# Patient Record
Sex: Female | Born: 1975
Health system: Southern US, Community
[De-identification: ages and names within clinical notes are randomized; demographics above are authoritative.]

## PROBLEM LIST (undated history)

## (undated) DIAGNOSIS — G43909 Migraine, unspecified, not intractable, without status migrainosus: Secondary | ICD-10-CM

## (undated) DIAGNOSIS — O09899 Supervision of other high risk pregnancies, unspecified trimester: Secondary | ICD-10-CM

## (undated) DIAGNOSIS — Z87440 Personal history of urinary (tract) infections: Secondary | ICD-10-CM

## (undated) DIAGNOSIS — IMO0002 Reserved for concepts with insufficient information to code with codable children: Secondary | ICD-10-CM

## (undated) DIAGNOSIS — F419 Anxiety disorder, unspecified: Secondary | ICD-10-CM

## (undated) DIAGNOSIS — R87619 Unspecified abnormal cytological findings in specimens from cervix uteri: Secondary | ICD-10-CM

## (undated) DIAGNOSIS — T7840XA Allergy, unspecified, initial encounter: Secondary | ICD-10-CM

## (undated) HISTORY — DX: Anxiety disorder, unspecified: F41.9

## (undated) HISTORY — PX: LAPAROSCOPIC APPENDECTOMY: SUR753

## (undated) HISTORY — PX: APPENDECTOMY: SHX54

## (undated) HISTORY — DX: Allergy, unspecified, initial encounter: T78.40XA

## (undated) HISTORY — PX: WISDOM TOOTH EXTRACTION: SHX21

## (undated) HISTORY — DX: Migraine, unspecified, not intractable, without status migrainosus: G43.909

## (undated) HISTORY — PX: COLPOSCOPY: SHX161

---

## 2006-07-19 ENCOUNTER — Observation Stay (HOSPITAL_COMMUNITY): Admission: EM | Admit: 2006-07-19 | Discharge: 2006-07-20 | Payer: Self-pay | Admitting: Emergency Medicine

## 2006-07-19 ENCOUNTER — Ambulatory Visit (HOSPITAL_COMMUNITY): Admission: RE | Admit: 2006-07-19 | Discharge: 2006-07-19 | Payer: Self-pay

## 2006-07-20 ENCOUNTER — Encounter (INDEPENDENT_AMBULATORY_CARE_PROVIDER_SITE_OTHER): Payer: Self-pay | Admitting: *Deleted

## 2007-01-12 ENCOUNTER — Other Ambulatory Visit: Admission: RE | Admit: 2007-01-12 | Discharge: 2007-01-12 | Payer: Self-pay | Admitting: Gynecology

## 2008-02-21 ENCOUNTER — Other Ambulatory Visit: Admission: RE | Admit: 2008-02-21 | Discharge: 2008-02-21 | Payer: Self-pay | Admitting: Gynecology

## 2010-02-17 ENCOUNTER — Inpatient Hospital Stay (HOSPITAL_COMMUNITY): Admission: AD | Admit: 2010-02-17 | Discharge: 2010-02-17 | Payer: Self-pay | Admitting: Obstetrics & Gynecology

## 2010-02-18 ENCOUNTER — Inpatient Hospital Stay (HOSPITAL_COMMUNITY): Admission: AD | Admit: 2010-02-18 | Discharge: 2010-02-21 | Payer: Self-pay | Admitting: Obstetrics and Gynecology

## 2010-02-22 ENCOUNTER — Ambulatory Visit: Admission: RE | Admit: 2010-02-22 | Discharge: 2010-02-22 | Payer: Self-pay | Admitting: Obstetrics and Gynecology

## 2010-09-13 LAB — COMPREHENSIVE METABOLIC PANEL
Albumin: 2.7 g/dL — ABNORMAL LOW (ref 3.5–5.2)
Alkaline Phosphatase: 123 U/L — ABNORMAL HIGH (ref 39–117)
BUN: 5 mg/dL — ABNORMAL LOW (ref 6–23)
CO2: 22 mEq/L (ref 19–32)
Calcium: 8.5 mg/dL (ref 8.4–10.5)
Chloride: 107 mEq/L (ref 96–112)
Creatinine, Ser: 0.54 mg/dL (ref 0.4–1.2)
Glucose, Bld: 81 mg/dL (ref 70–99)
Potassium: 3.7 mEq/L (ref 3.5–5.1)
Sodium: 135 mEq/L (ref 135–145)
Total Protein: 6.1 g/dL (ref 6.0–8.3)

## 2010-09-13 LAB — URINALYSIS, ROUTINE W REFLEX MICROSCOPIC
Ketones, ur: NEGATIVE mg/dL
Nitrite: NEGATIVE
Protein, ur: NEGATIVE mg/dL
Specific Gravity, Urine: <1.005 — ABNORMAL LOW (ref 1.005–1.030)

## 2010-09-13 LAB — CBC
HCT: 37 % (ref 36.0–46.0)
HCT: 37.9 % (ref 36.0–46.0)
Hemoglobin: 12.6 g/dL (ref 12.0–15.0)
MCH: 32.3 pg (ref 26.0–34.0)
MCHC: 33.3 g/dL (ref 30.0–36.0)
MCHC: 34 g/dL (ref 30.0–36.0)
MCHC: 35 g/dL (ref 30.0–36.0)
MCV: 94.9 fL (ref 78.0–100.0)
MCV: 96.2 fL (ref 78.0–100.0)
Platelets: 198 10*3/uL (ref 150–400)
Platelets: 229 10*3/uL (ref 150–400)
RBC: 3.1 MIL/uL — ABNORMAL LOW (ref 3.87–5.11)
RDW: 12 % (ref 11.5–15.5)
RDW: 12.1 % (ref 11.5–15.5)
WBC: 6.2 10*3/uL (ref 4.0–10.5)

## 2010-09-13 LAB — PROTEIN / CREATININE RATIO, URINE: Total Protein, Urine: <6 mg/dL

## 2010-09-13 LAB — URINE MICROSCOPIC-ADD ON: WBC, UA: NONE SEEN WBC/hpf (ref <3)

## 2010-09-13 LAB — URIC ACID: Uric Acid, Serum: 5 mg/dL (ref 2.4–7.0)

## 2010-09-13 LAB — LACTATE DEHYDROGENASE: LDH: 151 U/L (ref 94–250)

## 2010-11-15 NOTE — Op Note (Signed)
NAMEMONSERRATH, JUNIO             ACCOUNT NO.:  [id number]   MEDICAL RECORD NO.:  [id number]          PATIENT TYPE:  INP   LOCATION:  5742                         FACILITY:  [facility]   PHYSICIAN:  Adolph Pollack, M.D.DATE OF BIRTH:  [birth year]   DATE OF PROCEDURE:  07/19/2006  DATE OF DISCHARGE:                               OPERATIVE REPORT   PREOPERATIVE DIAGNOSIS:  Acute appendicitis.   POSTOPERATIVE DIAGNOSIS:  Acute appendicitis.   PROCEDURE:  Laparoscopic appendectomy.   SURGEON:  Adolph Pollack, M.D.   ANESTHESIA:  General.   INDICATIONS:  A 35 year old female had abrupt onset of right lower  quadrant pain.  CT scan demonstrated acute appendicitis.  She now  presents for the above procedure.   TECHNIQUE:  She was seen holding area, then brought to the operating  room, placed supine on the operating table, and a general anesthetic was  administered.  A Foley catheter placed in the bladder.  The abdominal  wall was sterilely prepped and draped.  Dilute Marcaine solution was  infiltrated in the subumbilical region.  A subumbilical incision was  made through the skin, subcutaneous tissue, fascia and peritoneum,  entering the peritoneal cavity under direct vision.  A pursestring  suture of 0 Vicryl was then placed around the fascial edges.  A Hassan  trocar was introduced into the peritoneal cavity and a pneumoperitoneum  created by insufflation of CO2 gas.   Next a laparoscope was introduced.  She was placed in a Trendelenburg  position, the right side tilted slightly up.  A 5 mm trocar was placed  in the left lower quadrant and one in the right upper quadrant.  I  identified the acutely inflamed appendix that did not appear to be  perforated.  The mesoappendix was grasped and the appendix retracted  anteriorly.  I then divided some lateral peritoneal attachments with the  Harmonic scalpel and divided the mesoappendix with the Harmonic scalpel  down to the base of  the cecum.  I then amputated the appendix off the  base of the cecum using the Endo-GIA stapler.  The appendix was put an  Endopouch bag and removed through the subumbilical port.   The subumbilical trocar was replaced.  There was a little bit of  bleeding from the staple line and I applied Hemoclips and these  controlled it.  I then irrigated out the abdominal cavity with a liter  solution and evacuated as much as possible.  No further bleeding was  noted.   The subumbilical trocar was removed and the fascial defect closed under  laparoscopic vision by tightening up and tying down the pursestring  suture.  The remaining trocars were removed and the pneumoperitoneum was  released.  The skin incisions were closed with 4-0 Monocryl subcuticular  stitches followed by Steri-Strips and sterile dressings.   She tolerated the procedure without any apparent complications and was  taken to the recovery room in satisfactory condition.      Adolph Pollack, M.D.  Electronically Signed     TJR/MEDQ  D:  07/19/2006  T:  07/20/2006  Job:  [id number]

## 2010-11-15 NOTE — H&P (Signed)
Beverly Gray, Beverly Gray             ACCOUNT NO.:  [id number]   MEDICAL RECORD NO.:  [id number]          PATIENT TYPE:  INP   LOCATION:  5742                         FACILITY:  [facility]   PHYSICIAN:  Adolph Pollack, M.D.DATE OF BIRTH:  1976-05-04   DATE OF ADMISSION:  07/19/2006  DATE OF DISCHARGE:                              HISTORY & PHYSICAL   REASON FOR ADMISSION:  Acute appendicitis.   HISTORY OF PRESENT ILLNESS:  This is a 35 year old white healthy female  who had the abrupt onset of sharp right lower quadrant pain last night  after eating [place] food.  She tried to treat herself with some  nonsteroidals.  She did have a fever of 101.  The pain persisted and she  went to [facility].  She is noted to a white blood cell  count of 11,100 and negative urine pregnancy test.  She is then sent for  CT scan and came to [facility] where that was performed and  showed acute appendicitis.   PAST MEDICAL HISTORY:  Urinary tract infections.   PREVIOUS OPERATIONS:  Oral surgeries.   ALLERGIES:  None.   MEDICATIONS:  Yasmin oral contraceptive.   SOCIAL HISTORY:  She is married.  Denies tobacco use.  Occasional  alcohol use.  Has moved up here recently from [place].   REVIEW OF SYSTEMS:  GENERAL:  She has otherwise been in good health.  CARDIOVASCULAR:  No heart disease or hypertension.  PULMONARY:  No  asthma, pneumonia.  GI:  No peptic ulcer disease, diverticulitis.  GU:  No kidney stones.  HEMATOLOGIC:  No known bleeding disorders, blood  clots.   PHYSICAL EXAM:  GENERALLY:  A slightly ill female, very pleasant and  cooperative.  Temperature is 97.9, blood pressure is 104/67, pulse 78.  EYES:  Extraocular motions intact.  No icterus.  Neck is supple without  masses or obvious thyroid enlargement.  RESPIRATORY:  Breath sounds equal and clear.  Respirations are  unlabored.  CARDIOVASCULAR:  Demonstrates a regular rate and regular rhythm.  No  murmur.  ABDOMEN:  Is soft.  There is right lower quadrant tenderness and  guarding to palpation and percussion.  Occasional bowel sounds are  heard.  No palpable masses.  EXTREMITIES:  No clubbing, cyanosis or edema.   CT scan was reviewed.   IMPRESSION:  Acute appendicitis-does not appear perforated at this time.   PLAN:  Admit to the hospital, IV antibiotics, laparoscopic possible open  appendectomy.  I have gone over the procedure and the risks with her and  her husband.  The risks include but are not limited to bleeding,  infection, wound healing problems, anesthesia, accidental damage to  internal abdominal organs.  They seem to understand this and agree to  proceed.      Adolph Pollack, M.D.  Electronically Signed     TJR/MEDQ  D:  07/19/2006  T:  07/20/2006  Job:  [id number]

## 2011-01-17 ENCOUNTER — Other Ambulatory Visit: Payer: Self-pay | Admitting: Obstetrics and Gynecology

## 2011-02-11 LAB — ABO/RH: RH Type: POSITIVE

## 2011-07-01 NOTE — L&D Delivery Note (Signed)
Delivery Note At 7:56 PM a viable and healthy female was delivered via Vaginal, Spontaneous Delivery (Presentation: Left Occiput Anterior).  APGAR: 9, 9; weight 7 lb 15.3 oz (3610 g).   Placenta status: Intact, Spontaneous.  Cord: 3 vessels.  Cervix without laceration.  Anesthesia: Pudendal  Episiotomy: Median Lacerations: None  Suture Repair: 3.0 chromic Est. Blood Loss (mL): 400  Mom to postpartum.  Baby to nursery-stable.  Mickel Baas 08/26/2011, 8:41 PM

## 2011-08-26 ENCOUNTER — Encounter (HOSPITAL_COMMUNITY): Payer: Self-pay | Admitting: *Deleted

## 2011-08-26 ENCOUNTER — Inpatient Hospital Stay (HOSPITAL_COMMUNITY)
Admission: AD | Admit: 2011-08-26 | Discharge: 2011-08-28 | DRG: 373 | Disposition: A | Discharge status: Home / Self Care | Payer: BC Managed Care – PPO | Source: Ambulatory Visit | Attending: Obstetrics & Gynecology | Admitting: Obstetrics & Gynecology

## 2011-08-26 ENCOUNTER — Inpatient Hospital Stay (HOSPITAL_COMMUNITY)
Admission: AD | Admit: 2011-08-26 | Discharge: 2011-08-26 | Disposition: A | Discharge status: Hospice | Payer: BC Managed Care – PPO | Source: Ambulatory Visit | Attending: Obstetrics & Gynecology | Admitting: Obstetrics & Gynecology

## 2011-08-26 ENCOUNTER — Encounter (HOSPITAL_COMMUNITY): Payer: Self-pay

## 2011-08-26 DIAGNOSIS — O99892 Other specified diseases and conditions complicating childbirth: Principal | ICD-10-CM | POA: Diagnosis present

## 2011-08-26 DIAGNOSIS — Z22330 Carrier of Group B streptococcus: Secondary | ICD-10-CM

## 2011-08-26 DIAGNOSIS — O479 False labor, unspecified: Secondary | ICD-10-CM | POA: Insufficient documentation

## 2011-08-26 HISTORY — DX: Unspecified abnormal cytological findings in specimens from cervix uteri: R87.619

## 2011-08-26 HISTORY — DX: Supervision of other high risk pregnancies, unspecified trimester: O09.899

## 2011-08-26 HISTORY — DX: Reserved for concepts with insufficient information to code with codable children: IMO0002

## 2011-08-26 HISTORY — DX: Personal history of urinary (tract) infections: Z87.440

## 2011-08-26 LAB — ANTIBODY SCREEN: Antibody Screen: NEGATIVE

## 2011-08-26 LAB — HEPATITIS B SURFACE ANTIGEN: Hepatitis B Surface Ag: NEGATIVE

## 2011-08-26 LAB — CBC
HCT: 35.7 % — ABNORMAL LOW (ref 36.0–46.0)
Hemoglobin: 12.3 g/dL (ref 12.0–15.0)
MCH: 30.7 pg (ref 26.0–34.0)
RBC: 4.01 MIL/uL (ref 3.87–5.11)

## 2011-08-26 LAB — RUBELLA ANTIBODY, IGM: Rubella: IMMUNE

## 2011-08-26 LAB — RPR: RPR: NONREACTIVE

## 2011-08-26 LAB — GC/CHLAMYDIA PROBE AMP, GENITAL

## 2011-08-26 LAB — HM PAP SMEAR

## 2011-08-26 MED ORDER — ZOLPIDEM TARTRATE 5 MG PO TABS: 5.0000 mg | ORAL_TABLET | Freq: Every evening | ORAL | Status: DC | PRN

## 2011-08-26 MED ORDER — LACTATED RINGERS IV SOLN: INTRAVENOUS | Status: DC

## 2011-08-26 MED ORDER — ONDANSETRON HCL 4 MG/2ML IJ SOLN: 4.0000 mg | Freq: Four times a day (QID) | INTRAMUSCULAR | Status: DC | PRN

## 2011-08-26 MED ORDER — WITCH HAZEL-GLYCERIN EX PADS
1.0000 "application " | MEDICATED_PAD | CUTANEOUS | Status: DC | PRN
  Administered 2011-08-27: 1 via TOPICAL

## 2011-08-26 MED ORDER — LIDOCAINE HCL (PF) 1 % IJ SOLN
INTRAMUSCULAR | Status: AC
  Filled 2011-08-26: qty 30

## 2011-08-26 MED ORDER — TETANUS-DIPHTH-ACELL PERTUSSIS 5-2.5-18.5 LF-MCG/0.5 IM SUSP
0.5000 mL | Freq: Once | INTRAMUSCULAR | Status: AC
  Administered 2011-08-27: 0.5 mL via INTRAMUSCULAR
  Filled 2011-08-26: qty 0.5

## 2011-08-26 MED ORDER — ACETAMINOPHEN 325 MG PO TABS: 650.0000 mg | ORAL_TABLET | ORAL | Status: DC | PRN

## 2011-08-26 MED ORDER — OXYCODONE-ACETAMINOPHEN 5-325 MG PO TABS
1.0000 | ORAL_TABLET | ORAL | Status: DC | PRN
  Administered 2011-08-27 (×3): 1 via ORAL
  Filled 2011-08-26 (×3): qty 1

## 2011-08-26 MED ORDER — SENNOSIDES-DOCUSATE SODIUM 8.6-50 MG PO TABS
2.0000 | ORAL_TABLET | Freq: Every day | ORAL | Status: DC
  Administered 2011-08-27: 2 via ORAL

## 2011-08-26 MED ORDER — IBUPROFEN 600 MG PO TABS
600.0000 mg | ORAL_TABLET | Freq: Four times a day (QID) | ORAL | Status: DC
  Administered 2011-08-27 – 2011-08-28 (×5): 600 mg via ORAL
  Filled 2011-08-26 (×5): qty 1

## 2011-08-26 MED ORDER — BENZOCAINE-MENTHOL 20-0.5 % EX AERO
1.0000 "application " | INHALATION_SPRAY | CUTANEOUS | Status: DC | PRN
  Administered 2011-08-27: 1 via TOPICAL

## 2011-08-26 MED ORDER — IBUPROFEN 600 MG PO TABS
600.0000 mg | ORAL_TABLET | Freq: Four times a day (QID) | ORAL | Status: DC | PRN
  Administered 2011-08-26: 600 mg via ORAL
  Filled 2011-08-26: qty 1

## 2011-08-26 MED ORDER — ONDANSETRON HCL 4 MG PO TABS: 4.0000 mg | ORAL_TABLET | ORAL | Status: DC | PRN

## 2011-08-26 MED ORDER — OXYCODONE-ACETAMINOPHEN 5-325 MG PO TABS: 1.0000 | ORAL_TABLET | ORAL | Status: DC | PRN

## 2011-08-26 MED ORDER — LANOLIN HYDROUS EX OINT: TOPICAL_OINTMENT | CUTANEOUS | Status: DC | PRN

## 2011-08-26 MED ORDER — CITRIC ACID-SODIUM CITRATE 334-500 MG/5ML PO SOLN: 30.0000 mL | ORAL | Status: DC | PRN

## 2011-08-26 MED ORDER — PRENATAL MULTIVITAMIN CH
1.0000 | ORAL_TABLET | Freq: Every day | ORAL | Status: DC
  Administered 2011-08-27: 1 via ORAL
  Filled 2011-08-26: qty 1

## 2011-08-26 MED ORDER — BUTORPHANOL TARTRATE 2 MG/ML IJ SOLN
1.0000 mg | INTRAMUSCULAR | Status: DC | PRN
  Administered 2011-08-26: 1 mg via INTRAVENOUS

## 2011-08-26 MED ORDER — OXYTOCIN BOLUS FROM INFUSION
500.0000 mL | Freq: Once | INTRAVENOUS | Status: DC
  Filled 2011-08-26: qty 500

## 2011-08-26 MED ORDER — OXYTOCIN 20 UNITS IN LACTATED RINGERS INFUSION - SIMPLE
INTRAVENOUS | Status: AC
  Filled 2011-08-26: qty 1000

## 2011-08-26 MED ORDER — LIDOCAINE HCL (PF) 1 % IJ SOLN: 30.0000 mL | INTRAMUSCULAR | Status: DC | PRN

## 2011-08-26 MED ORDER — SIMETHICONE 80 MG PO CHEW: 80.0000 mg | CHEWABLE_TABLET | ORAL | Status: DC | PRN

## 2011-08-26 MED ORDER — FLEET ENEMA 7-19 GM/118ML RE ENEM: 1.0000 | ENEMA | RECTAL | Status: DC | PRN

## 2011-08-26 MED ORDER — DIPHENHYDRAMINE HCL 25 MG PO CAPS: 25.0000 mg | ORAL_CAPSULE | Freq: Four times a day (QID) | ORAL | Status: DC | PRN

## 2011-08-26 MED ORDER — LACTATED RINGERS IV SOLN: 500.0000 mL | INTRAVENOUS | Status: DC | PRN

## 2011-08-26 MED ORDER — ONDANSETRON HCL 4 MG/2ML IJ SOLN: 4.0000 mg | INTRAMUSCULAR | Status: DC | PRN

## 2011-08-26 MED ORDER — OXYTOCIN 20 UNITS IN LACTATED RINGERS INFUSION - SIMPLE: 125.0000 mL/h | Freq: Once | INTRAVENOUS | Status: DC

## 2011-08-26 MED ORDER — DIBUCAINE 1 % RE OINT
1.0000 "application " | TOPICAL_OINTMENT | RECTAL | Status: DC | PRN
  Administered 2011-08-27: 1 via RECTAL
  Filled 2011-08-26: qty 28

## 2011-08-26 MED ORDER — SODIUM CHLORIDE 0.9 % IV SOLN
2.0000 g | Freq: Four times a day (QID) | INTRAVENOUS | Status: DC
  Administered 2011-08-26: 2 g via INTRAVENOUS
  Filled 2011-08-26 (×2): qty 2000

## 2011-08-26 MED ORDER — BUTORPHANOL TARTRATE 2 MG/ML IJ SOLN
INTRAMUSCULAR | Status: AC
  Administered 2011-08-26: 1 mg via INTRAVENOUS
  Filled 2011-08-26: qty 1

## 2011-08-26 NOTE — Progress Notes (Signed)
Patient is for labor eval. She states that her membranes were stripped this morning and was 3cm/80 by dr Henderson Cloud. She denies any vaginal bleeding or lof. She reports good fetal movement.

## 2011-08-26 NOTE — Progress Notes (Signed)
Dr Arlyce Dice notified of patient, tracing(including 2 possible late decel at approx. 1316 and 1324), ctx pattern and sve result. Dr Arlyce Dice reviewed tracing and states that the tracing is reactive without decel. His order is for patient to be discharge home.

## 2011-08-26 NOTE — H&P (Signed)
36 y.o. G2P1  Estimated Date of Delivery: 08/30/11 admitted at 39/[redacted] weeks gestation in active labor. Prenatal course was uncomplicated. Prenatal labs: Blood Type:O+.  Screening tests for HIV, Syphilis, Hepatitis B, Rubella sensitivity, fetal anomalies, and gestational diabetes were negative.  GBS screen was positive.   Afebrile, VSS Heart and Lungs: No active disease Abdomen: soft, gravid, EFW 7-8lbs. Cervical exam:  8-9/complete  Impression: Imminent delivery  Plan:  Ampicillin 2 gm IV stat for GBS prophylaxis

## 2011-08-27 LAB — CBC
Hemoglobin: 10.5 g/dL — ABNORMAL LOW (ref 12.0–15.0)
MCH: 30.4 pg (ref 26.0–34.0)
MCHC: 33.3 g/dL (ref 30.0–36.0)
MCV: 91.3 fL (ref 78.0–100.0)

## 2011-08-27 LAB — RPR: RPR Ser Ql: NONREACTIVE

## 2011-08-27 MED ORDER — BENZOCAINE-MENTHOL 20-0.5 % EX AERO
INHALATION_SPRAY | CUTANEOUS | Status: AC
  Administered 2011-08-27: 1 via TOPICAL
  Filled 2011-08-27: qty 56

## 2011-08-27 NOTE — Progress Notes (Signed)
Both Mother and Pecola Leisure doing well.  Does desire circ.  Circ discussed with Mom and Dad.  Breast feed.  CBC today:  10.5/14.9 with 209K platelets.  Mom to remind Peds that she did probably not get adequate antibiotic coverage for Group B Strep because of rapidity of presentation and delivery.

## 2011-08-28 NOTE — Progress Notes (Signed)
Patient is eating, ambulating, voiding.  Pain control is good.  Filed Vitals:   08/27/11 0608 08/27/11 1045 08/27/11 2119 08/28/11 0547  BP: 94/54 110/69 107/68 112/74  Pulse: 78 86 70 66  Temp: 98.1 F (36.7 C) 97.4 F (36.3 C) 97.7 F (36.5 C) 98.6 F (37 C)  TempSrc: Oral Oral Oral Oral  Resp: 16 16 18 18   Height:      Weight:      SpO2: 96% 97%      Fundus firm Perineum without swelling.  Lab Results  Component Value Date   WBC 14.9* 08/27/2011   HGB 10.5* 08/27/2011   HCT 31.5* 08/27/2011   MCV 91.3 08/27/2011   PLT 209 08/27/2011    O/Positive/-- (08/14 0000)/RI  A/P Post partum day 2.  Routine care.  Expect d/c today.    Kiyoshi Schaab,Buffi A

## 2011-08-28 NOTE — Discharge Summary (Signed)
Obstetric Discharge Summary Reason for Admission: onset of labor Prenatal Procedures: none Intrapartum Procedures: spontaneous vaginal delivery Postpartum Procedures: none Complications-Operative and Postpartum: none Hemoglobin  Date Value Range Status  08/27/2011 10.5* 12.0-15.0 (g/dL) Final     HCT  Date Value Range Status  08/27/2011 31.5* 36.0-46.0 (%) Final    Discharge Diagnoses: Term Pregnancy-delivered  Discharge Information: Date: 08/28/2011 Activity: pelvic rest Diet: routine Medications: Ibuprofen Condition: stable Instructions: refer to practice specific booklet Discharge to: home Follow-up Information    Follow up with Mickel Baas, MD. Schedule an appointment as soon as possible for a visit in 4 weeks.   Contact information:   [address] [place] [address] [phone]          Newborn Data: Live born female  Birth Weight: 7 lb 15.3 oz (3609 g) APGAR: 9, 9  Home with mother.  Nicanor Mendolia,Patsy A 08/28/2011, 6:41 AM

## 2013-12-07 ENCOUNTER — Telehealth: Payer: Self-pay | Admitting: Infectious Disease

## 2013-12-07 DIAGNOSIS — M25579 Pain in unspecified ankle and joints of unspecified foot: Secondary | ICD-10-CM | POA: Insufficient documentation

## 2013-12-07 NOTE — Telephone Encounter (Signed)
Opt with pain in foot after fall

## 2013-12-08 ENCOUNTER — Other Ambulatory Visit: Payer: Self-pay | Admitting: Infectious Disease

## 2013-12-08 ENCOUNTER — Ambulatory Visit
Admission: RE | Admit: 2013-12-08 | Discharge: 2013-12-08 | Disposition: A | Discharge status: Home / Self Care | Payer: BC Managed Care – PPO | Source: Ambulatory Visit | Attending: Infectious Disease | Admitting: Infectious Disease

## 2013-12-08 ENCOUNTER — Encounter (INDEPENDENT_AMBULATORY_CARE_PROVIDER_SITE_OTHER): Payer: Self-pay

## 2013-12-08 DIAGNOSIS — M25579 Pain in unspecified ankle and joints of unspecified foot: Secondary | ICD-10-CM

## 2013-12-28 ENCOUNTER — Encounter: Payer: Self-pay | Admitting: Gastroenterology

## 2014-03-02 ENCOUNTER — Ambulatory Visit (INDEPENDENT_AMBULATORY_CARE_PROVIDER_SITE_OTHER): Payer: BC Managed Care – PPO | Admitting: Gastroenterology

## 2014-03-02 ENCOUNTER — Encounter: Payer: Self-pay | Admitting: Gastroenterology

## 2014-03-02 VITALS — BP 116/60 | HR 88 | Ht 64.0 in | Wt 160.4 lb

## 2014-03-02 DIAGNOSIS — R197 Diarrhea, unspecified: Secondary | ICD-10-CM

## 2014-03-02 DIAGNOSIS — Z800 Family history of malignant neoplasm of digestive organs: Secondary | ICD-10-CM

## 2014-03-02 DIAGNOSIS — K219 Gastro-esophageal reflux disease without esophagitis: Secondary | ICD-10-CM

## 2014-03-02 MED ORDER — PEG-KCL-NACL-NASULF-NA ASC-C 100 G PO SOLR: 1.0000 | Freq: Once | ORAL | Status: DC

## 2014-03-02 NOTE — Patient Instructions (Signed)
You have been scheduled for a colonoscopy. Please follow written instructions given to you at your visit today.  Please pick up your prep kit at the pharmacy within the next 1-3 days. If you use inhalers (even only as needed), please bring them with you on the day of your procedure. Your physician has requested that you go to www.startemmi.com and enter the access code given to you at your visit today. This web site gives a general overview about your procedure. However, you should still follow specific instructions given to you by our office regarding your preparation for the procedure.  Patient advised to avoid spicy, acidic, citrus, chocolate, mints, fruit and fruit juices.  Limit the intake of caffeine, alcohol and Soda.  Don't exercise too soon after eating.  Don't lie down within 3-4 hours of eating.  Elevate the head of your bed.  Thank you for choosing me and [facility].  Pricilla Riffle. Dagoberto Ligas., MD., Marval Regal  cc: Florina Ou, MD

## 2014-03-02 NOTE — Progress Notes (Signed)
    History of Present Illness: This is a 38 year old female who presents for evaluation of a family history of colon cancer, intermittent diarrhea and reflux symptoms. States her father was diagnosed with advanced stage colon cancer at age 78. He had gastrointestinal symptoms for a few years before his diagnosis. Patient notes intermittent episodes of diarrhea associated with stress and certain foods since she was a child. The symptoms have not changed. She also notes frequent reflux symptoms since the birth of her 2 children. She takes TUMS and Zantac couple times each week with properly for the symptoms. Denies weight loss, abdominal pain, constipation, change in stool caliber, melena, hematochezia, nausea, vomiting, dysphagia, chest pain.    Review of Systems: Pertinent positive and negative review of systems were noted in the above HPI section. All other review of systems were otherwise negative.  Current Medications, Allergies, Past Medical History, Past Surgical History, Family History and Social History were reviewed in [employer] record.  Physical Exam: General: Well developed , well nourished, no acute distress Head: Normocephalic and atraumatic Eyes:  sclerae anicteric, EOMI Ears: Normal auditory acuity Mouth: No deformity or lesions Neck: Supple, no masses or thyromegaly Lungs: Clear throughout to auscultation Heart: Regular rate and rhythm; no murmurs, rubs or bruits Abdomen: Soft, non tender and non distended. No masses, hepatosplenomegaly or hernias noted. Normal Bowel sounds Rectal: Deferred to colonoscopy Musculoskeletal: Symmetrical with no gross deformities  Skin: No lesions on visible extremities Pulses:  Normal pulses noted Extremities: No clubbing, cyanosis, edema or deformities noted Neurological: Alert oriented x 4, grossly nonfocal Cervical Nodes:  No significant cervical adenopathy Inguinal Nodes: No significant inguinal  adenopathy Psychological:  Alert and cooperative. Normal mood and affect  Assessment and Recommendations:  1 Family history of colon cancer in her father at age 45.  Advanced stage colon cancer at the time of his diagnosis. I recommended that she proceed with colonoscopy no later than age 85 and it is certainly reasonable to start colon cancer screening now. The risks, benefits, and alternatives to colonoscopy with possible biopsy and possible polypectomy were discussed with the patient and they consent to proceed.   2. GERD. Standard antireflux measures. Zantac and TUMS as needed.  3. Intermittent diarrhea that appears to be functional.

## 2014-03-14 ENCOUNTER — Encounter: Payer: Self-pay | Admitting: Gastroenterology

## 2014-03-31 ENCOUNTER — Other Ambulatory Visit: Payer: Self-pay | Admitting: Internal Medicine

## 2014-03-31 DIAGNOSIS — B373 Candidiasis of vulva and vagina: Secondary | ICD-10-CM

## 2014-03-31 DIAGNOSIS — B3731 Acute candidiasis of vulva and vagina: Secondary | ICD-10-CM

## 2014-03-31 MED ORDER — FLUCONAZOLE 150 MG PO TABS: 150.0000 mg | ORAL_TABLET | Freq: Every day | ORAL | Status: DC

## 2014-03-31 NOTE — Progress Notes (Signed)
Patient reports having vaginal discharge c/w vaginal yeast infection in setting of completing 7 day course of augmentin. Also started her menses. Will prescribe diflucan to treat vaginal yeast infection.

## 2014-04-18 ENCOUNTER — Encounter: Payer: BC Managed Care – PPO | Admitting: Gastroenterology

## 2014-05-01 ENCOUNTER — Encounter: Payer: Self-pay | Admitting: Gastroenterology

## 2015-05-28 ENCOUNTER — Telehealth: Payer: Self-pay | Admitting: Infectious Diseases

## 2015-05-28 NOTE — Telephone Encounter (Signed)
Pt asked for valtrex refill for concern over recurrence of VZV.  A paper rx is done for her.

## 2015-11-09 ENCOUNTER — Ambulatory Visit (INDEPENDENT_AMBULATORY_CARE_PROVIDER_SITE_OTHER): Payer: 59 | Admitting: Family Medicine

## 2015-11-09 ENCOUNTER — Encounter: Payer: Self-pay | Admitting: Family Medicine

## 2015-11-09 VITALS — BP 106/72 | HR 76 | Temp 98.2°F | Resp 16 | Ht 64.0 in | Wt 164.4 lb

## 2015-11-09 DIAGNOSIS — G43109 Migraine with aura, not intractable, without status migrainosus: Secondary | ICD-10-CM | POA: Diagnosis not present

## 2015-11-09 MED ORDER — SUMATRIPTAN SUCCINATE 50 MG PO TABS: 50.0000 mg | ORAL_TABLET | ORAL | Status: DC | PRN

## 2015-11-09 NOTE — Patient Instructions (Signed)
Schedule your complete physical in 6 months Take the Imitrex as needed for migraines If symptoms change or worsen- please let me know Call with any questions or concerns Welcome!  We're glad to have you!!!

## 2015-11-09 NOTE — Progress Notes (Signed)
Pre visit review using our clinic review tool, if applicable. No additional management support is needed unless otherwise documented below in the visit note. 

## 2015-11-09 NOTE — Progress Notes (Signed)
   Subjective:    Patient ID: Beverly Gray, female    DOB: 1975-11-12, 40 y.o.   MRN: [id number]  HPI New to establish.  Previous MD- Greta Doom  GYN- [facility]  Migraines- started at age 56.  Improved w/ pregnancy.  Have returned recently.  Previously controlled w/ Imitrex or Relpax.  Since pregnancy has been able to treat w/ excedrin, ibuprofen.  Monday had 'a really bad one'- + aura, sensitivity to light, sound, smells.  At times will have difficulty speaking or hearing or comprehending.  Has seen Neuro- Dr Earley Favor- hemiplegic migraines.  The HA from Monday persisted into Tuesday but resolved w/ 50mg  Imitrex.     Review of Systems For ROS see HPI     Objective:   Physical Exam  Constitutional: She is oriented to person, place, and time. She appears well-developed and well-nourished. No distress.  HENT:  Head: Normocephalic and atraumatic.  TMs WNL No TTP over sinuses Minimal nasal congestion  Eyes: Conjunctivae and EOM are normal. Pupils are equal, round, and reactive to light.  Neck: Normal range of motion. Neck supple.  Cardiovascular: Normal rate, regular rhythm, normal heart sounds and intact distal pulses.   Pulmonary/Chest: Effort normal and breath sounds normal. No respiratory distress. She has no wheezes. She has no rales.  Lymphadenopathy:    She has no cervical adenopathy.  Neurological: She is alert and oriented to person, place, and time. She has normal reflexes. No cranial nerve deficit. Coordination normal.  Psychiatric: She has a normal mood and affect. Her behavior is normal. Judgment and thought content normal.  Vitals reviewed.         Assessment & Plan:

## 2015-11-09 NOTE — Assessment & Plan Note (Signed)
New to provider, ongoing for pt.  She has hx of hemiplegic migraines and has had complete neuro workup.  Her HAs, including atypical sxs, improve w/ triptan therapy.  Will restart Imitrex for PRN use.  Reviewed supportive care and red flags that should prompt return.  Pt expressed understanding and is in agreement w/ plan.

## 2015-11-20 ENCOUNTER — Encounter: Payer: Self-pay | Admitting: Family Medicine

## 2015-11-21 MED FILL — SUMATRIPTAN SUCC 50 MG TAB: 50 | 30 days supply | Qty: 9 | Fill #0

## 2015-12-17 MED FILL — SUMATRIPTAN SUCC 50 MG TAB: 50 | 30 days supply | Qty: 9 | Fill #1

## 2015-12-21 ENCOUNTER — Ambulatory Visit: Payer: Self-pay | Admitting: Family Medicine

## 2016-01-23 ENCOUNTER — Encounter: Payer: Self-pay | Admitting: Internal Medicine

## 2016-01-23 ENCOUNTER — Ambulatory Visit (INDEPENDENT_AMBULATORY_CARE_PROVIDER_SITE_OTHER)
Admission: RE | Admit: 2016-01-23 | Discharge: 2016-01-23 | Disposition: A | Discharge status: Home / Self Care | Payer: 59 | Source: Ambulatory Visit | Attending: Internal Medicine | Admitting: Internal Medicine

## 2016-01-23 ENCOUNTER — Ambulatory Visit (INDEPENDENT_AMBULATORY_CARE_PROVIDER_SITE_OTHER): Payer: 59 | Admitting: Internal Medicine

## 2016-01-23 DIAGNOSIS — S300XXA Contusion of lower back and pelvis, initial encounter: Secondary | ICD-10-CM | POA: Insufficient documentation

## 2016-01-23 NOTE — Progress Notes (Signed)
Subjective:  Patient ID: Beverly Gray, female    DOB: 09/14/1975  Age: 40 y.o. MRN: [id number]  CC: Fall (on Saturday she slipped down 4-5 steps hardwood steps) and Bleeding/Bruising (left buttock )   HPI Beverly Gray presents for L buttock large painful bruise - the pt slid fell down the steps on Sat. No LOC, blood in urine, weakness.  Outpatient Medications Prior to Visit  Medication Sig Dispense Refill  . cetirizine (ZYRTEC) 10 MG tablet Take 10 mg by mouth daily.    . Multiple Vitamins-Minerals (MULTIVITAMIN ADULT PO) Take by mouth.    . SUMAtriptan (IMITREX) 50 MG tablet Take 1 tablet (50 mg total) by mouth every 2 (two) hours as needed for migraine. May repeat in 2 hours if headache persists or recurs. 10 tablet 6   No facility-administered medications prior to visit.     ROS Review of Systems  Constitutional: Negative for fever.  Respiratory: Negative for chest tightness.   Gastrointestinal: Negative for abdominal pain, rectal pain and vomiting.  Genitourinary: Negative for difficulty urinating, enuresis, hematuria, pelvic pain and urgency.  Hematological: Does not bruise/bleed easily.    Objective:  BP 120/86   Pulse 67   Wt 164 lb (74.4 kg)   SpO2 96%   BMI 28.15 kg/m   BP Readings from Last 3 Encounters:  01/23/16 120/86  11/09/15 106/72  03/02/14 116/60    Wt Readings from Last 3 Encounters:  01/23/16 164 lb (74.4 kg)  11/09/15 164 lb 6 oz (74.6 kg)  03/02/14 160 lb 6 oz (72.7 kg)    Physical Exam  Constitutional: She appears well-developed. No distress.  HENT:  Head: Normocephalic.  Right Ear: External ear normal.  Left Ear: External ear normal.  Nose: Nose normal.  Mouth/Throat: Oropharynx is clear and moist.  Eyes: Conjunctivae are normal. Pupils are equal, round, and reactive to light. Right eye exhibits no discharge. Left eye exhibits no discharge.  Neck: Normal range of motion. Neck supple. No JVD present. No tracheal deviation present.  No thyromegaly present.  Cardiovascular: Normal rate, regular rhythm and normal heart sounds.   Pulmonary/Chest: No stridor. No respiratory distress. She has no wheezes.  Abdominal: Soft. Bowel sounds are normal. She exhibits no distension and no mass. There is no tenderness. There is no rebound and no guarding.  Musculoskeletal: She exhibits tenderness. She exhibits no edema.  Lymphadenopathy:    She has no cervical adenopathy.  Neurological: She displays normal reflexes. No cranial nerve deficit. She exhibits normal muscle tone. Coordination normal.  Skin: No rash noted. No erythema.  Psychiatric: She has a normal mood and affect. Her behavior is normal. Judgment and thought content normal.  Large prox L buttock bruise extending to the L sacral area 25x20 cm  Lab Results  Component Value Date   WBC 14.9 (H) 08/27/2011   HGB 10.5 (L) 08/27/2011   HCT 31.5 (L) 08/27/2011   PLT 209 08/27/2011   GLUCOSE 81 02/17/2010   ALT 19 02/17/2010   AST 26 02/17/2010   NA 135 02/17/2010   K 3.7 02/17/2010   CL 107 02/17/2010   CREATININE 0.54 02/17/2010   BUN 5 (L) 02/17/2010   CO2 22 02/17/2010    Dg Foot Complete Left  Result Date: 12/08/2013 CLINICAL DATA:  Pain post trauma EXAM: LEFT FOOT - COMPLETE 3+ VIEW COMPARISON:  None. FINDINGS: Frontal, oblique, and lateral views were obtained. There is no fracture or dislocation. Joint spaces appear intact. No erosive change. IMPRESSION: No abnormality  noted. Electronically Signed   By: Lowella Grip M.D.   On: 12/08/2013 08:23    Assessment & Plan:   There are no diagnoses linked to this encounter. I am having [name] maintain her Multiple Vitamins-Minerals (MULTIVITAMIN ADULT PO), cetirizine, and SUMAtriptan.  No orders of the defined types were placed in this encounter.    Follow-up: No Follow-up on file.  Walker Kehr, MD

## 2016-01-23 NOTE — Patient Instructions (Signed)
Heat Arnica Vitamin C

## 2016-01-23 NOTE — Assessment & Plan Note (Addendum)
L - large hematoma Pelvis X ray Heat

## 2016-02-07 DIAGNOSIS — H5213 Myopia, bilateral: Secondary | ICD-10-CM | POA: Diagnosis not present

## 2016-02-25 ENCOUNTER — Ambulatory Visit (INDEPENDENT_AMBULATORY_CARE_PROVIDER_SITE_OTHER): Payer: 59 | Admitting: Family Medicine

## 2016-02-25 ENCOUNTER — Other Ambulatory Visit: Payer: Self-pay | Admitting: Family Medicine

## 2016-02-25 ENCOUNTER — Encounter: Payer: Self-pay | Admitting: Family Medicine

## 2016-02-25 VITALS — BP 110/72 | HR 92 | Temp 98.1°F | Resp 16 | Ht 64.0 in | Wt 164.0 lb

## 2016-02-25 DIAGNOSIS — R197 Diarrhea, unspecified: Secondary | ICD-10-CM | POA: Diagnosis not present

## 2016-02-25 LAB — HEPATIC FUNCTION PANEL
ALBUMIN: 4.2 g/dL (ref 3.5–5.2)
ALT: 14 U/L (ref 0–35)
AST: 16 U/L (ref 0–37)
Alkaline Phosphatase: 69 U/L (ref 39–117)
BILIRUBIN DIRECT: 0.1 mg/dL (ref 0.0–0.3)
BILIRUBIN TOTAL: 0.3 mg/dL (ref 0.2–1.2)
Total Protein: 6.8 g/dL (ref 6.0–8.3)

## 2016-02-25 LAB — BASIC METABOLIC PANEL
BUN: 9 mg/dL (ref 6–23)
CALCIUM: 8.9 mg/dL (ref 8.4–10.5)
CHLORIDE: 105 meq/L (ref 96–112)
CO2: 28 meq/L (ref 19–32)
Creatinine, Ser: 0.62 mg/dL (ref 0.40–1.20)
GFR: 114.12 mL/min (ref >60.00)
Glucose, Bld: 81 mg/dL (ref 70–99)
Potassium: 3.8 mEq/L (ref 3.5–5.1)
SODIUM: 138 meq/L (ref 135–145)

## 2016-02-25 LAB — CBC WITH DIFFERENTIAL/PLATELET
BASOS PCT: 0.6 % (ref 0.0–3.0)
Basophils Absolute: 0 10*3/uL (ref 0.0–0.1)
EOS ABS: 0.2 10*3/uL (ref 0.0–0.7)
Eosinophils Relative: 5.1 % — ABNORMAL HIGH (ref 0.0–5.0)
HCT: 37.4 % (ref 36.0–46.0)
HEMOGLOBIN: 12.7 g/dL (ref 12.0–15.0)
LYMPHS ABS: 2.2 10*3/uL (ref 0.7–4.0)
Lymphocytes Relative: 47.8 % — ABNORMAL HIGH (ref 12.0–46.0)
MCHC: 34 g/dL (ref 30.0–36.0)
MCV: 90.6 fl (ref 78.0–100.0)
MONO ABS: 0.6 10*3/uL (ref 0.1–1.0)
Monocytes Relative: 12.3 % — ABNORMAL HIGH (ref 3.0–12.0)
NEUTROS PCT: 34.2 % — AB (ref 43.0–77.0)
Neutro Abs: 1.6 10*3/uL (ref 1.4–7.7)
PLATELETS: 239 10*3/uL (ref 150.0–400.0)
RBC: 4.12 Mil/uL (ref 3.87–5.11)
RDW: 12.1 % (ref 11.5–15.5)
WBC: 4.6 10*3/uL (ref 4.0–10.5)

## 2016-02-25 NOTE — Patient Instructions (Addendum)
Follow up as needed We'll notify you of your lab results and make any changes if needed Please bring back the stool samples as you are able Drink plenty of fluids to avoid dehydration Call with any questions or concerns Hang in there!!!

## 2016-02-25 NOTE — Progress Notes (Signed)
Pre visit review using our clinic review tool, if applicable. No additional management support is needed unless otherwise documented below in the visit note. 

## 2016-02-25 NOTE — Progress Notes (Signed)
   Subjective:    Patient ID: Beverly Gray, female    DOB: 06-Dec-1975, 40 y.o.   MRN: [id number]  HPI Diarrhea- sxs started 5 days ago on Wednesday night.  Initially thought it was food poisoning b/c 5-6 others in the office developed similar sxs.  No fever.  No N/V.  + abd cramping, bloating, stools vary from loose to watery.  No blood in stool.  No mucous.  At least 3 (plus pt) are still sick.  Pt works at [employer].  No one at home has developed similar sxs.  Stools are foul smelling.  New puppy at home.  At least 5 stools/day   Review of Systems For ROS see HPI     Objective:   Physical Exam  Constitutional: She is oriented to person, place, and time. She appears well-developed and well-nourished. No distress.  HENT:  Head: Normocephalic and atraumatic.  MMM  Neck: Neck supple.  Cardiovascular: Normal rate, regular rhythm and intact distal pulses.   Pulmonary/Chest: Effort normal and breath sounds normal. No respiratory distress. She has no wheezes. She has no rales.  Abdominal: Soft. She exhibits no distension. There is no tenderness. There is no rebound.  Hyperactive BS  Lymphadenopathy:    She has no cervical adenopathy.  Neurological: She is alert and oriented to person, place, and time.  Skin: Skin is warm and dry.  Vitals reviewed.         Assessment & Plan:  Diarrhea- new.  Given duration, lack of fever, and lack of bloody stools suspect that this is not food poisoning.  Will get Rota and C diff studies.  Hold on abx tx until stool studies available.  Get labs to r/o dehydration.  Reviewed supportive care and red flags that should prompt return.  Pt expressed understanding and is in agreement w/ plan.

## 2016-02-26 ENCOUNTER — Encounter: Payer: Self-pay | Admitting: Family Medicine

## 2016-02-26 ENCOUNTER — Other Ambulatory Visit: Payer: Self-pay | Admitting: Obstetrics and Gynecology

## 2016-02-26 DIAGNOSIS — Z124 Encounter for screening for malignant neoplasm of cervix: Secondary | ICD-10-CM | POA: Diagnosis not present

## 2016-02-26 DIAGNOSIS — Z6828 Body mass index (BMI) 28.0-28.9, adult: Secondary | ICD-10-CM | POA: Diagnosis not present

## 2016-02-26 DIAGNOSIS — Z01419 Encounter for gynecological examination (general) (routine) without abnormal findings: Secondary | ICD-10-CM | POA: Diagnosis not present

## 2016-02-26 LAB — HM PAP SMEAR

## 2016-02-26 LAB — C. DIFFICILE GDH AND TOXIN A/B
C. DIFFICILE GDH: NOT DETECTED
C. difficile Toxin A/B: NOT DETECTED

## 2016-02-27 ENCOUNTER — Encounter: Payer: Self-pay | Admitting: Family Medicine

## 2016-02-27 ENCOUNTER — Encounter: Payer: Self-pay | Admitting: General Practice

## 2016-02-27 LAB — ROTAVIRUS ANTIGEN, STOOL: Rotavirus: NOT DETECTED

## 2016-02-27 LAB — CYTOLOGY - PAP

## 2016-02-29 LAB — STOOL CULTURE

## 2016-03-31 MED FILL — SUMATRIPTAN SUCC 50 MG TAB: 50 | 30 days supply | Qty: 9 | Fill #2

## 2016-04-25 ENCOUNTER — Encounter: Payer: Self-pay | Admitting: Family Medicine

## 2016-05-07 ENCOUNTER — Encounter: Payer: 59 | Admitting: Family Medicine

## 2016-05-20 ENCOUNTER — Other Ambulatory Visit: Payer: Self-pay | Admitting: Obstetrics and Gynecology

## 2016-05-20 DIAGNOSIS — N920 Excessive and frequent menstruation with regular cycle: Secondary | ICD-10-CM | POA: Diagnosis not present

## 2016-05-20 DIAGNOSIS — Z3202 Encounter for pregnancy test, result negative: Secondary | ICD-10-CM | POA: Diagnosis not present

## 2016-05-20 DIAGNOSIS — D261 Other benign neoplasm of corpus uteri: Secondary | ICD-10-CM | POA: Diagnosis not present

## 2016-05-20 HISTORY — PX: OTHER SURGICAL HISTORY: SHX169

## 2016-05-27 MED FILL — SUMATRIPTAN SUCC 50 MG TAB: 50 | 30 days supply | Qty: 9 | Fill #3

## 2016-06-11 ENCOUNTER — Ambulatory Visit (INDEPENDENT_AMBULATORY_CARE_PROVIDER_SITE_OTHER): Payer: 59 | Admitting: Family Medicine

## 2016-06-11 ENCOUNTER — Encounter: Payer: Self-pay | Admitting: Family Medicine

## 2016-06-11 VITALS — BP 108/70 | HR 78 | Temp 98.0°F | Resp 16 | Ht 64.0 in | Wt 162.2 lb

## 2016-06-11 DIAGNOSIS — Z0000 Encounter for general adult medical examination without abnormal findings: Secondary | ICD-10-CM | POA: Diagnosis not present

## 2016-06-11 LAB — LIPID PANEL
CHOLESTEROL: 210 mg/dL — AB (ref 0–200)
HDL: 68.1 mg/dL (ref >39.00)
LDL CALC: 126 mg/dL — AB (ref 0–99)
NonHDL: 142.24
TRIGLYCERIDES: 82 mg/dL (ref 0.0–149.0)
Total CHOL/HDL Ratio: 3
VLDL: 16.4 mg/dL (ref 0.0–40.0)

## 2016-06-11 LAB — HEPATIC FUNCTION PANEL
ALBUMIN: 4.6 g/dL (ref 3.5–5.2)
ALK PHOS: 80 U/L (ref 39–117)
ALT: 18 U/L (ref 0–35)
AST: 18 U/L (ref 0–37)
BILIRUBIN DIRECT: 0.1 mg/dL (ref 0.0–0.3)
TOTAL PROTEIN: 7.6 g/dL (ref 6.0–8.3)
Total Bilirubin: 0.7 mg/dL (ref 0.2–1.2)

## 2016-06-11 LAB — BASIC METABOLIC PANEL
BUN: 11 mg/dL (ref 6–23)
CO2: 27 meq/L (ref 19–32)
Calcium: 9.6 mg/dL (ref 8.4–10.5)
Chloride: 104 mEq/L (ref 96–112)
Creatinine, Ser: 0.66 mg/dL (ref 0.40–1.20)
GFR: 106.01 mL/min (ref >60.00)
GLUCOSE: 91 mg/dL (ref 70–99)
POTASSIUM: 4 meq/L (ref 3.5–5.1)
SODIUM: 138 meq/L (ref 135–145)

## 2016-06-11 LAB — CBC WITH DIFFERENTIAL/PLATELET
BASOS PCT: 0.6 % (ref 0.0–3.0)
Basophils Absolute: 0 10*3/uL (ref 0.0–0.1)
EOS PCT: 2.8 % (ref 0.0–5.0)
Eosinophils Absolute: 0.2 10*3/uL (ref 0.0–0.7)
HEMATOCRIT: 42 % (ref 36.0–46.0)
Hemoglobin: 14.1 g/dL (ref 12.0–15.0)
LYMPHS ABS: 3 10*3/uL (ref 0.7–4.0)
Lymphocytes Relative: 53.1 % — ABNORMAL HIGH (ref 12.0–46.0)
MCHC: 33.6 g/dL (ref 30.0–36.0)
MCV: 90.2 fl (ref 78.0–100.0)
MONOS PCT: 10.5 % (ref 3.0–12.0)
Monocytes Absolute: 0.6 10*3/uL (ref 0.1–1.0)
NEUTROS ABS: 1.8 10*3/uL (ref 1.4–7.7)
NEUTROS PCT: 33 % — AB (ref 43.0–77.0)
PLATELETS: 257 10*3/uL (ref 150.0–400.0)
RBC: 4.65 Mil/uL (ref 3.87–5.11)
RDW: 12.3 % (ref 11.5–15.5)
WBC: 5.6 10*3/uL (ref 4.0–10.5)

## 2016-06-11 LAB — TSH: TSH: 0.86 u[IU]/mL (ref 0.35–4.50)

## 2016-06-11 LAB — VITAMIN D 25 HYDROXY (VIT D DEFICIENCY, FRACTURES): VITD: 29.75 ng/mL — ABNORMAL LOW (ref 30.00–100.00)

## 2016-06-11 NOTE — Progress Notes (Signed)
Pre visit review using our clinic review tool, if applicable. No additional management support is needed unless otherwise documented below in the visit note. 

## 2016-06-11 NOTE — Assessment & Plan Note (Signed)
Pt's PE WNL.  UTD on GYN, immunizations.  Check labs.  Anticipatory guidance provided.  

## 2016-06-11 NOTE — Patient Instructions (Signed)
Follow up in 1 year or as needed We'll notify you of your lab results and make any changes if needed Keep up the good work on healthy diet and regular exercise- you look great! Call with any questions or concerns Happy Holidays!!! 

## 2016-06-11 NOTE — Progress Notes (Signed)
   Subjective:    Patient ID: Beverly Gray, female    DOB: 1976/04/02, 40 y.o.   MRN: [id number]  HPI CPE- UTD on GYN, Tdap, flu.  Too young for mammo.  No concerns today.   Review of Systems Patient reports no vision/ hearing changes, adenopathy,fever, weight change,  persistant/recurrent hoarseness , swallowing issues, chest pain, palpitations, edema, persistant/recurrent cough, hemoptysis, dyspnea (rest/exertional/paroxysmal nocturnal), gastrointestinal bleeding (melena, rectal bleeding), abdominal pain, significant heartburn, bowel changes, GU symptoms (dysuria, hematuria, incontinence), Gyn symptoms (abnormal  bleeding, pain),  syncope, focal weakness, memory loss, numbness & tingling, skin/hair/nail changes, abnormal bruising or bleeding, anxiety, or depression.     Objective:   Physical Exam General Appearance:    Alert, cooperative, no distress, appears stated age  Head:    Normocephalic, without obvious abnormality, atraumatic  Eyes:    PERRL, conjunctiva/corneas clear, EOM's intact, fundi    benign, both eyes  Ears:    Normal TM's and external ear canals, both ears  Nose:   Nares normal, septum midline, mucosa normal, no drainage    or sinus tenderness  Throat:   Lips, mucosa, and tongue normal; teeth and gums normal  Neck:   Supple, symmetrical, trachea midline, no adenopathy;    Thyroid: no enlargement/tenderness/nodules  Back:     Symmetric, no curvature, ROM normal, no CVA tenderness  Lungs:     Clear to auscultation bilaterally, respirations unlabored  Chest Wall:    No tenderness or deformity   Heart:    Regular rate and rhythm, S1 and S2 normal, no murmur, rub   or gallop  Breast Exam:    Deferred to GYN  Abdomen:     Soft, non-tender, bowel sounds active all four quadrants,    no masses, no organomegaly  Genitalia:    Deferred to GYN  Rectal:    Extremities:   Extremities normal, atraumatic, no cyanosis or edema  Pulses:   2+ and symmetric all extremities  Skin:    Skin color, texture, turgor normal, no rashes or lesions  Lymph nodes:   Cervical, supraclavicular, and axillary nodes normal  Neurologic:   CNII-XII intact, normal strength, sensation and reflexes    throughout          Assessment & Plan:

## 2016-07-07 MED FILL — SUMATRIPTAN SUCC 50 MG TAB: 50 | 30 days supply | Qty: 9 | Fill #4

## 2016-08-15 ENCOUNTER — Encounter: Payer: Self-pay | Admitting: Family Medicine

## 2016-08-15 ENCOUNTER — Ambulatory Visit (INDEPENDENT_AMBULATORY_CARE_PROVIDER_SITE_OTHER): Payer: 59 | Admitting: Family Medicine

## 2016-08-15 VITALS — BP 112/78 | HR 70 | Temp 98.0°F | Resp 16 | Ht 64.0 in | Wt 161.5 lb

## 2016-08-15 DIAGNOSIS — G43109 Migraine with aura, not intractable, without status migrainosus: Secondary | ICD-10-CM | POA: Diagnosis not present

## 2016-08-15 MED ORDER — PROPRANOLOL HCL ER 60 MG PO CP24: 60.0000 mg | ORAL_CAPSULE | Freq: Every day | ORAL | 3 refills | Status: DC

## 2016-08-15 MED ORDER — SUMATRIPTAN SUCCINATE 100 MG PO TABS: 100.0000 mg | ORAL_TABLET | Freq: Once | ORAL | 6 refills | Status: DC

## 2016-08-15 MED FILL — SUMATRIPTAN SUCC 100 MG TAB: 100 | 30 days supply | Qty: 18 | Fill #0

## 2016-08-15 MED FILL — PROPRANOLOL ER 60 MG CAP: 60 | 30 days supply | Qty: 30 | Fill #0

## 2016-08-15 NOTE — Progress Notes (Signed)
Pre visit review using our clinic review tool, if applicable. No additional management support is needed unless otherwise documented below in the visit note. 

## 2016-08-15 NOTE — Assessment & Plan Note (Signed)
Deteriorated.  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  Will start beta blocker prophylaxis and monitor for decrease in sxs.  Increase the Imitrex to 100mg  daily due to requiring 2 tabs each migraine.  Reviewed supportive care and red flags that should prompt return.  Pt expressed understanding and is in agreement w/ plan.

## 2016-08-15 NOTE — Progress Notes (Signed)
   Subjective:    Patient ID: Beverly Gray, female    DOB: 1975-07-30, 41 y.o.   MRN: [id number]  HPI Migraines- pt reports they are back to 'pre-pregnancy levels'.  HAs are occurring weekly, having 4-5 HAs monthly.  Requiring 2 imitex to break the HA and the next day she's 'wiped out'.  Currently asymptomatic.  Pt is aware of triggers- stress, poor sleep, dehydration, caffeine.  Pt requires imitrex + sleep to break the headache.   Review of Systems For ROS see HPI     Objective:   Physical Exam  Constitutional: She is oriented to person, place, and time. She appears well-developed and well-nourished. No distress.  HENT:  Head: Normocephalic and atraumatic.  Eyes: Conjunctivae and EOM are normal. Pupils are equal, round, and reactive to light.  Neck: Normal range of motion. Neck supple.  Neurological: She is alert and oriented to person, place, and time. She has normal reflexes. No cranial nerve deficit. Coordination normal.  Skin: Skin is warm and dry.  Psychiatric: She has a normal mood and affect. Her behavior is normal. Thought content normal.  Vitals reviewed.         Assessment & Plan:

## 2016-08-15 NOTE — Patient Instructions (Addendum)
Follow up by phone or MyChart in 1 month and let me know how the migraines are doing Start the Propranolol once daily Drink plenty of fluids Continue the Imitrex as needed- I increased the dose to 100mg  so start w/ 1 tab as needed Call with any questions or concerns Hang in there!!!

## 2016-11-18 MED FILL — SUMATRIPTAN SUCC 100 MG TAB: 100 | 30 days supply | Qty: 18 | Fill #1

## 2017-01-22 MED FILL — SUMATRIPTAN SUCC 100 MG TAB: 100 | 30 days supply | Qty: 18 | Fill #2

## 2017-03-06 MED FILL — SUMATRIPTAN SUCC 100 MG TAB: 100 | 30 days supply | Qty: 16 | Fill #3

## 2017-03-27 DIAGNOSIS — H5213 Myopia, bilateral: Secondary | ICD-10-CM | POA: Diagnosis not present

## 2017-05-28 ENCOUNTER — Other Ambulatory Visit: Payer: Self-pay | Admitting: Family Medicine

## 2017-05-28 ENCOUNTER — Encounter: Payer: Self-pay | Admitting: Family Medicine

## 2017-05-28 MED ORDER — SUMATRIPTAN SUCCINATE 100 MG PO TABS: 100.0000 mg | ORAL_TABLET | Freq: Once | ORAL | 6 refills | Status: DC

## 2017-05-28 MED ORDER — PROPRANOLOL HCL 20 MG PO TABS: 20.0000 mg | ORAL_TABLET | Freq: Two times a day (BID) | ORAL | 3 refills | Status: DC

## 2017-05-28 MED FILL — SUMATRIPTAN SUCC 100 MG TAB: 100 | 28 days supply | Qty: 10 | Fill #0

## 2017-05-28 MED FILL — PROPRANOLOL 20 MG TABLET: 20 | 30 days supply | Qty: 60 | Fill #0

## 2017-05-28 NOTE — Telephone Encounter (Signed)
Medication filled by pcp

## 2017-06-26 MED FILL — SUMATRIPTAN SUCC 100 MG TAB: 100 | 28 days supply | Qty: 10 | Fill #1

## 2017-07-15 MED FILL — PROPRANOLOL 20 MG TABLET: 20 | 30 days supply | Qty: 60 | Fill #1

## 2017-07-30 DIAGNOSIS — Z1231 Encounter for screening mammogram for malignant neoplasm of breast: Secondary | ICD-10-CM | POA: Diagnosis not present

## 2017-07-30 DIAGNOSIS — Z01419 Encounter for gynecological examination (general) (routine) without abnormal findings: Secondary | ICD-10-CM | POA: Diagnosis not present

## 2017-07-30 DIAGNOSIS — Z6828 Body mass index (BMI) 28.0-28.9, adult: Secondary | ICD-10-CM | POA: Diagnosis not present

## 2017-07-30 LAB — HM MAMMOGRAPHY: HM Mammogram: NORMAL (ref 0–4)

## 2017-08-03 ENCOUNTER — Other Ambulatory Visit: Payer: Self-pay | Admitting: Obstetrics and Gynecology

## 2017-08-03 DIAGNOSIS — R928 Other abnormal and inconclusive findings on diagnostic imaging of breast: Secondary | ICD-10-CM

## 2017-08-04 ENCOUNTER — Encounter: Payer: Self-pay | Admitting: Family

## 2017-08-04 ENCOUNTER — Ambulatory Visit: Payer: Self-pay | Admitting: Family

## 2017-08-04 VITALS — BP 105/70 | HR 83 | Temp 98.2°F | Resp 16 | Wt 172.4 lb

## 2017-08-04 DIAGNOSIS — N3001 Acute cystitis with hematuria: Secondary | ICD-10-CM

## 2017-08-04 DIAGNOSIS — R300 Dysuria: Secondary | ICD-10-CM

## 2017-08-04 LAB — POCT URINALYSIS DIPSTICK
Glucose, UA: NEGATIVE
KETONES UA: NEGATIVE
NITRITE UA: NEGATIVE
PH UA: 6 (ref 5.0–8.0)
PROTEIN UA: NEGATIVE
Spec Grav, UA: 1.01 (ref 1.010–1.025)
Urobilinogen, UA: NEGATIVE E.U./dL — AB

## 2017-08-04 MED ORDER — NITROFURANTOIN MONOHYD MACRO 100 MG PO CAPS: 100.0000 mg | ORAL_CAPSULE | Freq: Two times a day (BID) | ORAL | 0 refills | Status: DC

## 2017-08-04 NOTE — Patient Instructions (Signed)

## 2017-08-04 NOTE — Progress Notes (Signed)
   Subjective:    Patient ID: Beverly Gray, female    DOB: 1976-05-29, 42 y.o.   MRN: [id number]  Dysuria   This is a new problem. The current episode started in the past 7 days. The problem occurs intermittently. The problem has been gradually worsening. The quality of the pain is described as burning. The pain is at a severity of 4/10. The pain is mild. There has been no fever. Associated symptoms include frequency, hesitancy and urgency. Pertinent negatives include no chills, discharge, flank pain, hematuria, nausea or vomiting. She has tried increased fluids for the symptoms. The treatment provided no relief.      Review of Systems  Constitutional: Negative for chills.  Gastrointestinal: Negative for nausea and vomiting.  Genitourinary: Positive for dysuria, frequency, hesitancy and urgency. Negative for flank pain and hematuria.  All other systems reviewed and are negative.      Objective:   Physical Exam  Constitutional: She is oriented to person, place, and time. She appears well-developed and well-nourished. No distress.  HENT:  Head: Normocephalic and atraumatic.  Right Ear: External ear normal.  Mouth/Throat: Oropharynx is clear and moist.  Eyes: Pupils are equal, round, and reactive to light.  Neck: Normal range of motion. Neck supple. No thyromegaly present.  Cardiovascular: Normal rate, regular rhythm, normal heart sounds and intact distal pulses.  No murmur heard. Pulmonary/Chest: Effort normal and breath sounds normal. No respiratory distress. She has no wheezes.  Abdominal: Soft. Bowel sounds are normal. She exhibits no distension. There is tenderness (mild lower abd tenderness).  Musculoskeletal: Normal range of motion. She exhibits no edema or tenderness.  Neurological: She is alert and oriented to person, place, and time.  Skin: Skin is warm and dry.  Psychiatric: She has a normal mood and affect. Her behavior is normal. Judgment and thought content normal.   Vitals reviewed.     BP 105/70 (BP Location: Right Arm, Patient Position: Sitting, Cuff Size: Normal)   Pulse 83   Temp 98.2 F (36.8 C) (Oral)   Resp 16   Wt 172 lb 6.4 oz (78.2 kg)   SpO2 98%   BMI 29.59 kg/m      Assessment & Plan:  1. Dysuria  - POCT urinalysis dipstick  2. Acute cystitis with hematuria Force fluids AZO over the counter X2 days RTO prn - nitrofurantoin, macrocrystal-monohydrate, (MACROBID) 100 MG capsule; Take 1 capsule (100 mg total) by mouth 2 (two) times daily. 1 po BId  Dispense: 14 capsule; Refill: 0   Evelina Dun, FNP

## 2017-08-05 ENCOUNTER — Other Ambulatory Visit: Payer: Self-pay | Admitting: Obstetrics and Gynecology

## 2017-08-05 ENCOUNTER — Ambulatory Visit
Admission: RE | Admit: 2017-08-05 | Discharge: 2017-08-05 | Disposition: A | Discharge status: Home / Self Care | Payer: BLUE CROSS/BLUE SHIELD | Source: Ambulatory Visit | Attending: Obstetrics and Gynecology | Admitting: Obstetrics and Gynecology

## 2017-08-05 DIAGNOSIS — R928 Other abnormal and inconclusive findings on diagnostic imaging of breast: Secondary | ICD-10-CM

## 2017-08-05 DIAGNOSIS — N6321 Unspecified lump in the left breast, upper outer quadrant: Secondary | ICD-10-CM | POA: Diagnosis not present

## 2017-08-05 DIAGNOSIS — N6320 Unspecified lump in the left breast, unspecified quadrant: Secondary | ICD-10-CM

## 2017-08-06 ENCOUNTER — Encounter: Payer: Self-pay | Admitting: General Practice

## 2017-08-06 ENCOUNTER — Telehealth: Payer: Self-pay

## 2017-08-15 ENCOUNTER — Telehealth: Payer: BLUE CROSS/BLUE SHIELD | Admitting: Nurse Practitioner

## 2017-08-15 DIAGNOSIS — J111 Influenza due to unidentified influenza virus with other respiratory manifestations: Secondary | ICD-10-CM

## 2017-08-15 MED ORDER — OSELTAMIVIR PHOSPHATE 75 MG PO CAPS: 75.0000 mg | ORAL_CAPSULE | Freq: Two times a day (BID) | ORAL | 0 refills | Status: DC

## 2017-08-15 NOTE — Progress Notes (Signed)

## 2017-08-27 LAB — HM HEPATITIS C SCREENING LAB: HM Hepatitis Screen: NEGATIVE

## 2017-08-27 LAB — HM HIV SCREENING LAB: HM HIV Screening: NEGATIVE

## 2017-08-28 MED FILL — DESCOVY 200-25 MG TABS: 200-25 | 30 days supply | Qty: 30 | Fill #0

## 2017-08-28 MED FILL — TIVICAY 50 MG TABLET: 50 | 30 days supply | Qty: 30 | Fill #0

## 2017-09-07 MED FILL — PROPRANOLOL 20 MG TABLET: 20 | 30 days supply | Qty: 60 | Fill #2

## 2017-09-22 MED FILL — SUMATRIPTAN SUCC 100 MG TAB: 100 | 28 days supply | Qty: 10 | Fill #2

## 2017-09-29 ENCOUNTER — Encounter: Payer: Self-pay | Admitting: Internal Medicine

## 2017-12-17 MED FILL — SUMATRIPTAN SUCC 100 MG TAB: 100 | 28 days supply | Qty: 10 | Fill #3

## 2017-12-21 MED FILL — PROPRANOLOL 20 MG TABLET: 20 | 30 days supply | Qty: 60 | Fill #3

## 2018-01-29 MED FILL — SUMATRIPTAN SUCC 100 MG TAB: 100 | 28 days supply | Qty: 10 | Fill #4

## 2018-02-02 ENCOUNTER — Other Ambulatory Visit: Payer: Self-pay | Admitting: Obstetrics and Gynecology

## 2018-02-02 ENCOUNTER — Ambulatory Visit
Admission: RE | Admit: 2018-02-02 | Discharge: 2018-02-02 | Disposition: A | Discharge status: Home / Self Care | Payer: BLUE CROSS/BLUE SHIELD | Source: Ambulatory Visit | Attending: Obstetrics and Gynecology | Admitting: Obstetrics and Gynecology

## 2018-02-02 DIAGNOSIS — N6320 Unspecified lump in the left breast, unspecified quadrant: Secondary | ICD-10-CM

## 2018-03-08 MED FILL — SUMAtriptan SUCCINATE 100 M: 100 | 28 days supply | Qty: 10 | Fill #5

## 2018-03-12 ENCOUNTER — Other Ambulatory Visit: Payer: Self-pay | Admitting: Family Medicine

## 2018-03-12 NOTE — Telephone Encounter (Signed)
Last OV 08/15/16, NO future OV  Last filled 05/28/17, # [phone] with 3 refills

## 2018-03-16 ENCOUNTER — Encounter: Payer: Self-pay | Admitting: Family Medicine

## 2018-03-16 ENCOUNTER — Other Ambulatory Visit: Payer: Self-pay | Admitting: Family Medicine

## 2018-03-26 MED FILL — PROPRANOLOL 20 MG TABLET: 20 | 30 days supply | Qty: 60 | Fill #0

## 2018-04-14 DIAGNOSIS — H5213 Myopia, bilateral: Secondary | ICD-10-CM | POA: Diagnosis not present

## 2018-05-19 DIAGNOSIS — D225 Melanocytic nevi of trunk: Secondary | ICD-10-CM | POA: Diagnosis not present

## 2018-05-19 DIAGNOSIS — L814 Other melanin hyperpigmentation: Secondary | ICD-10-CM | POA: Diagnosis not present

## 2018-05-19 DIAGNOSIS — D2262 Melanocytic nevi of left upper limb, including shoulder: Secondary | ICD-10-CM | POA: Diagnosis not present

## 2018-05-19 DIAGNOSIS — D2261 Melanocytic nevi of right upper limb, including shoulder: Secondary | ICD-10-CM | POA: Diagnosis not present

## 2018-06-10 ENCOUNTER — Other Ambulatory Visit: Payer: Self-pay | Admitting: Family Medicine

## 2018-06-18 ENCOUNTER — Encounter: Payer: Self-pay | Admitting: Family Medicine

## 2018-06-18 ENCOUNTER — Ambulatory Visit (INDEPENDENT_AMBULATORY_CARE_PROVIDER_SITE_OTHER): Payer: BLUE CROSS/BLUE SHIELD | Admitting: Family Medicine

## 2018-06-18 ENCOUNTER — Other Ambulatory Visit: Payer: Self-pay

## 2018-06-18 VITALS — BP 110/78 | HR 78 | Temp 98.1°F | Resp 16 | Ht 64.0 in | Wt 169.2 lb

## 2018-06-18 DIAGNOSIS — Z800 Family history of malignant neoplasm of digestive organs: Secondary | ICD-10-CM | POA: Diagnosis not present

## 2018-06-18 DIAGNOSIS — E559 Vitamin D deficiency, unspecified: Secondary | ICD-10-CM | POA: Diagnosis not present

## 2018-06-18 DIAGNOSIS — E663 Overweight: Secondary | ICD-10-CM

## 2018-06-18 DIAGNOSIS — Z0000 Encounter for general adult medical examination without abnormal findings: Secondary | ICD-10-CM | POA: Diagnosis not present

## 2018-06-18 LAB — CBC WITH DIFFERENTIAL/PLATELET
Basophils Absolute: 0 10*3/uL (ref 0.0–0.1)
Basophils Relative: 0.8 % (ref 0.0–3.0)
EOS ABS: 0.1 10*3/uL (ref 0.0–0.7)
Eosinophils Relative: 2.7 % (ref 0.0–5.0)
HCT: 41 % (ref 36.0–46.0)
Hemoglobin: 13.8 g/dL (ref 12.0–15.0)
LYMPHS ABS: 2.4 10*3/uL (ref 0.7–4.0)
Lymphocytes Relative: 44.7 % (ref 12.0–46.0)
MCHC: 33.7 g/dL (ref 30.0–36.0)
MCV: 91.5 fl (ref 78.0–100.0)
MONO ABS: 0.5 10*3/uL (ref 0.1–1.0)
Monocytes Relative: 10.4 % (ref 3.0–12.0)
NEUTROS ABS: 2.2 10*3/uL (ref 1.4–7.7)
Neutrophils Relative %: 41.4 % — ABNORMAL LOW (ref 43.0–77.0)
PLATELETS: 253 10*3/uL (ref 150.0–400.0)
RBC: 4.48 Mil/uL (ref 3.87–5.11)
RDW: 11.8 % (ref 11.5–15.5)
WBC: 5.3 10*3/uL (ref 4.0–10.5)

## 2018-06-18 LAB — LIPID PANEL
Cholesterol: 164 mg/dL (ref 0–200)
HDL: 55.4 mg/dL (ref >39.00)
LDL Cholesterol: 92 mg/dL (ref 0–99)
NonHDL: 108.78
Total CHOL/HDL Ratio: 3
Triglycerides: 85 mg/dL (ref 0.0–149.0)
VLDL: 17 mg/dL (ref 0.0–40.0)

## 2018-06-18 LAB — TSH: TSH: 0.94 u[IU]/mL (ref 0.35–4.50)

## 2018-06-18 LAB — HEPATIC FUNCTION PANEL
ALK PHOS: 61 U/L (ref 39–117)
ALT: 14 U/L (ref 0–35)
AST: 15 U/L (ref 0–37)
Albumin: 4.5 g/dL (ref 3.5–5.2)
BILIRUBIN DIRECT: 0.1 mg/dL (ref 0.0–0.3)
Total Bilirubin: 0.5 mg/dL (ref 0.2–1.2)
Total Protein: 7.4 g/dL (ref 6.0–8.3)

## 2018-06-18 LAB — BASIC METABOLIC PANEL
BUN: 9 mg/dL (ref 6–23)
CHLORIDE: 103 meq/L (ref 96–112)
CO2: 27 meq/L (ref 19–32)
CREATININE: 0.7 mg/dL (ref 0.40–1.20)
Calcium: 9.2 mg/dL (ref 8.4–10.5)
GFR: 98.04 mL/min (ref >60.00)
Glucose, Bld: 89 mg/dL (ref 70–99)
Potassium: 3.7 mEq/L (ref 3.5–5.1)
SODIUM: 137 meq/L (ref 135–145)

## 2018-06-18 LAB — VITAMIN D 25 HYDROXY (VIT D DEFICIENCY, FRACTURES): VITD: 26.58 ng/mL — AB (ref 30.00–100.00)

## 2018-06-18 MED ORDER — PROPRANOLOL HCL 20 MG PO TABS: 20.0000 mg | ORAL_TABLET | Freq: Two times a day (BID) | ORAL | 3 refills | Status: DC

## 2018-06-18 MED ORDER — SUMATRIPTAN SUCCINATE 100 MG PO TABS: 100.0000 mg | ORAL_TABLET | Freq: Once | ORAL | 6 refills | Status: DC

## 2018-06-18 NOTE — Patient Instructions (Signed)
Follow up in 1 year or as needed We'll notify you of your lab results and make any changes if needed Continue to work on healthy diet and regular exercise- you look great! Call with any questions or concerns Happy Holidays!!!

## 2018-06-18 NOTE — Assessment & Plan Note (Signed)
Pt's PE WNL w/ exception of being overweight.  UTD on GYN, immunizations.  Check labs.  Anticipatory guidance provided.  

## 2018-06-18 NOTE — Addendum Note (Signed)
Addended by: Midge Minium on: 06/18/2018 01:47 PM   Modules accepted: Orders

## 2018-06-18 NOTE — Progress Notes (Signed)
   Subjective:    Patient ID: Beverly Gray, female    DOB: 01/06/1976, 42 y.o.   MRN: [id number]  HPI CPE- UTD on pap, mammo, flu, Tdap.  + family hx of colon cancer at young age, needs colonoscopy.   Review of Systems Patient reports no vision/ hearing changes, adenopathy,fever, weight change,  persistant/recurrent hoarseness , swallowing issues, chest pain, palpitations, edema, persistant/recurrent cough, hemoptysis, dyspnea (rest/exertional/paroxysmal nocturnal), gastrointestinal bleeding (melena, rectal bleeding), abdominal pain, significant heartburn, bowel changes, GU symptoms (dysuria, hematuria, incontinence), Gyn symptoms (abnormal  bleeding, pain),  syncope, focal weakness, memory loss, numbness & tingling, skin/hair/nail changes, abnormal bruising or bleeding, anxiety, or depression.     Objective:   Physical Exam General Appearance:    Alert, cooperative, no distress, appears stated age  Head:    Normocephalic, without obvious abnormality, atraumatic  Eyes:    PERRL, conjunctiva/corneas clear, EOM's intact, fundi    benign, both eyes  Ears:    Normal TM's and external ear canals, both ears  Nose:   Nares normal, septum midline, mucosa normal, no drainage    or sinus tenderness  Throat:   Lips, mucosa, and tongue normal; teeth and gums normal  Neck:   Supple, symmetrical, trachea midline, no adenopathy;    Thyroid: no enlargement/tenderness/nodules  Back:     Symmetric, no curvature, ROM normal, no CVA tenderness  Lungs:     Clear to auscultation bilaterally, respirations unlabored  Chest Wall:    No tenderness or deformity   Heart:    Regular rate and rhythm, S1 and S2 normal, no murmur, rub   or gallop  Breast Exam:    Deferred to GYN  Abdomen:     Soft, non-tender, bowel sounds active all four quadrants,    no masses, no organomegaly  Genitalia:    Deferred to GYN  Rectal:    Extremities:   Extremities normal, atraumatic, no cyanosis or edema  Pulses:   2+ and  symmetric all extremities  Skin:   Skin color, texture, turgor normal, no rashes or lesions  Lymph nodes:   Cervical, supraclavicular, and axillary nodes normal  Neurologic:   CNII-XII intact, normal strength, sensation and reflexes    throughout          Assessment & Plan:

## 2018-06-18 NOTE — Assessment & Plan Note (Signed)
Pt has hx of this.  Check labs and replete prn. 

## 2018-06-21 ENCOUNTER — Other Ambulatory Visit: Payer: Self-pay | Admitting: General Practice

## 2018-06-21 MED ORDER — VITAMIN D (ERGOCALCIFEROL) 1.25 MG (50000 UNIT) PO CAPS: 50000.0000 [IU] | ORAL_CAPSULE | ORAL | 0 refills | Status: DC

## 2018-07-08 ENCOUNTER — Telehealth: Payer: Self-pay | Admitting: Gastroenterology

## 2018-07-08 NOTE — Telephone Encounter (Signed)
OK 

## 2018-07-08 NOTE — Telephone Encounter (Signed)
Dr. Carlean Purl will you accept this patient?

## 2018-07-09 NOTE — Telephone Encounter (Signed)
LM on vmail to call back to schedule 

## 2018-07-21 ENCOUNTER — Encounter: Payer: Self-pay | Admitting: Internal Medicine

## 2018-08-06 ENCOUNTER — Ambulatory Visit
Admission: RE | Admit: 2018-08-06 | Discharge: 2018-08-06 | Disposition: A | Discharge status: Home / Self Care | Payer: BLUE CROSS/BLUE SHIELD | Source: Ambulatory Visit | Attending: Obstetrics and Gynecology | Admitting: Obstetrics and Gynecology

## 2018-08-06 DIAGNOSIS — N6002 Solitary cyst of left breast: Secondary | ICD-10-CM | POA: Diagnosis not present

## 2018-08-06 DIAGNOSIS — R928 Other abnormal and inconclusive findings on diagnostic imaging of breast: Secondary | ICD-10-CM | POA: Diagnosis not present

## 2018-08-06 DIAGNOSIS — N6320 Unspecified lump in the left breast, unspecified quadrant: Secondary | ICD-10-CM

## 2018-08-06 LAB — HM MAMMOGRAPHY: HM Mammogram: NORMAL (ref 0–4)

## 2018-08-24 ENCOUNTER — Ambulatory Visit: Payer: BLUE CROSS/BLUE SHIELD | Admitting: Internal Medicine

## 2018-08-24 ENCOUNTER — Encounter: Payer: Self-pay | Admitting: Internal Medicine

## 2018-08-24 VITALS — BP 106/62 | HR 76 | Ht 64.0 in | Wt 168.2 lb

## 2018-08-24 DIAGNOSIS — Z800 Family history of malignant neoplasm of digestive organs: Secondary | ICD-10-CM | POA: Diagnosis not present

## 2018-08-24 DIAGNOSIS — K580 Irritable bowel syndrome with diarrhea: Secondary | ICD-10-CM | POA: Diagnosis not present

## 2018-08-24 NOTE — Patient Instructions (Signed)
  You have been scheduled for a colonoscopy. Please follow written instructions given to you at your visit today.  Please pick up your prep supplies at the pharmacy within the next 1-3 days. If you use inhalers (even only as needed), please bring them with you on the day of your procedure.  Take the Florastor you ordered one twice a day for a month.    I appreciate the opportunity to care for you. Silvano Rusk, MD, [facility]

## 2018-08-24 NOTE — Progress Notes (Signed)
Beverly Gray 43 y.o. 06-Aug-1975 [id number]  Assessment & Plan:   Encounter Diagnoses  Name Primary?  . Irritable bowel syndrome with diarrhea - post infectious Yes  . Family history of colon cancer in father     Sounds like she has a postinfectious diarrhea type problem that has not been a major problem for quality of life but it is a nuisance.  I have advised her to try Florastor twice a day for a month.  But see if that works.  A fiber supplement such as Benefiber might be helpful as well but perhaps taking the Florastor will fix things.  Schedule colonoscopy after she completes her course of Florastor.  Could consider random biopsies though the flavor of her signs and symptoms is not that of a microscopic colitis.  Colonoscopy is for high risk screening because of the family history of colon cancer in her father.  I appreciate the opportunity to care for this patient. 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  The family had norovirus in December gave her children probiotics she took probiotics noted her husband and everybody else resolved but she has had some persistent mushy stools once a day without incontinence or significant abdominal pain.  1 stool a day that is mushy.  Her father had colon cancer diagnosed in his late 87s.  Unfortunately died from that.  She has 1 older and 1 younger sibling, sisters neither 1 of them of the had a colonoscopy yet.  She is ready to pursue colonoscopy.  Reflux symptoms are controlled with OTC H2Blocker or antacids.  She might use an H2 blocker 2 or 3 times a month. Allergies  Allergen Reactions  . Bee Venom Rash and Swelling   Current Meds  Medication Sig  . cetirizine (ZYRTEC) 10 MG tablet Take 10 mg by mouth daily as needed.   . Multiple Vitamins-Minerals  (MULTIVITAMIN ADULT PO) Take by mouth.  . Probiotic Product (ALOE 62229 & PROBIOTICS PO) Take 1 capsule by mouth daily.  . propranolol (INDERAL) 20 MG tablet Take 1 tablet (20 mg total) by mouth 2 (two) times daily. (Patient taking differently: Take 20 mg by mouth 2 (two) times daily as needed. )  . Vitamin D, Ergocalciferol, (DRISDOL) 1.25 MG (50000 UT) CAPS capsule Take 1 capsule (50,000 Units total) by mouth every 7 (seven) days.  . vitamin E 400 UNIT capsule Take 400 Units by mouth daily.   Past Medical History:  Diagnosis Date  . Abnormal Pap smear   . History of GBS (group B streptococcus) UTI, currently pregnant    with first preg  . Migraines    Past Surgical History:  Procedure Laterality Date  . COLPOSCOPY    . LAPAROSCOPIC APPENDECTOMY    . uterine ablation  05/20/2016  . 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   Married with 2 children, 1 son and 1 daughter   3 caffeinated beverages daily never smoker no tobacco no drug use   family history includes Breast cancer in her paternal aunt; Cancer in her paternal grandfather and paternal grandmother; Colon cancer (age of onset: 7) in her father; Colon polyps in her father; Heart disease in her maternal grandmother; Heart failure in  her maternal grandfather; Hypertension in her maternal grandfather; Kidney failure in her maternal grandfather; Lung cancer in her mother; Sjogren's syndrome in her paternal aunt; Spina bifida in her sister; Ulcerative colitis in her paternal aunt.   Review of Systems See HPI  Objective:   Physical Exam @BP  106/62   Pulse 76   Ht 5\' 4"  (1.626 m)   Wt 168 lb 3.2 oz (76.3 kg)   BMI 28.87 kg/m @  General:  NAD Eyes:   anicteric Lungs:  clear Heart::  S1S2 no rubs, murmurs or gallops Abdomen:  soft and nontender, BS+

## 2018-09-23 ENCOUNTER — Telehealth: Payer: Self-pay | Admitting: *Deleted

## 2018-09-23 NOTE — Telephone Encounter (Signed)
Called and notified of cancellation of appointment. Will call to reschedule when appointments are open.

## 2018-10-01 ENCOUNTER — Encounter: Payer: BLUE CROSS/BLUE SHIELD | Admitting: Internal Medicine

## 2018-11-02 ENCOUNTER — Telehealth: Payer: Self-pay | Admitting: *Deleted

## 2018-11-02 NOTE — Telephone Encounter (Signed)
Called  Patient to reschedule her colonoscopy for this month and she is unable to do it until the middle of June or the first of July. Will notify Dr. Carlean Purl. SM

## 2018-12-17 ENCOUNTER — Other Ambulatory Visit: Payer: Self-pay

## 2018-12-17 DIAGNOSIS — Z20822 Contact with and (suspected) exposure to covid-19: Secondary | ICD-10-CM

## 2018-12-20 LAB — NOVEL CORONAVIRUS, NAA: SARS-CoV-2, NAA: NOT DETECTED

## 2018-12-22 IMAGING — US ULTRASOUND LEFT BREAST LIMITED
1 series · 10 of 10 positions shown · non-contrast
Comparison: Baseline screening mammogram dated 07/30/2017.

CLINICAL DATA: Patient was called back from screening mammogram for
a left breast mass.

EXAM:
ULTRASOUND OF THE LEFT BREAST

[Series 1: ultrasound left breast limited · 0.06mm/px · 10 of 10 slices shown]
[im 1/10]
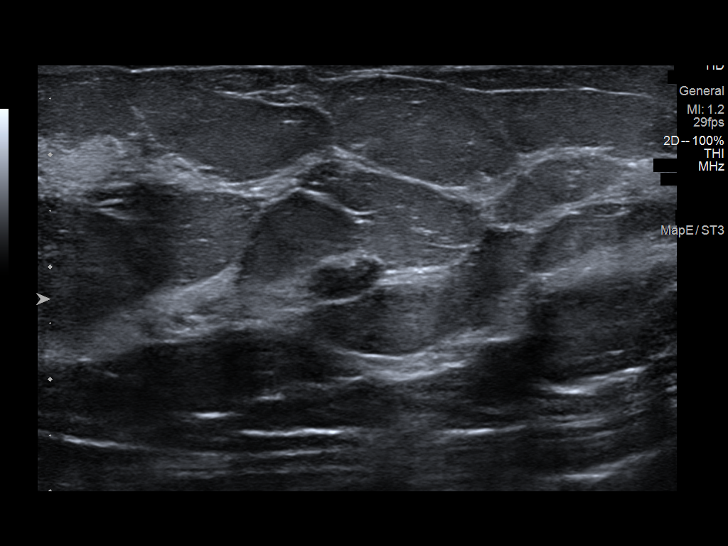
[im 2/10]
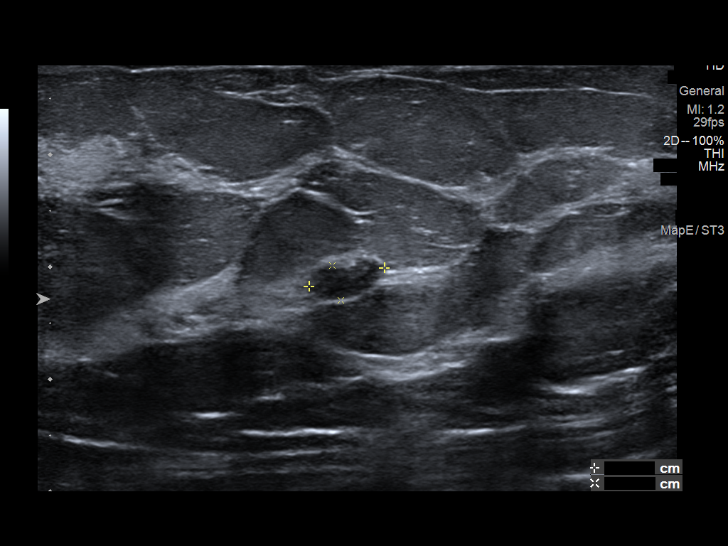
[im 3/10]
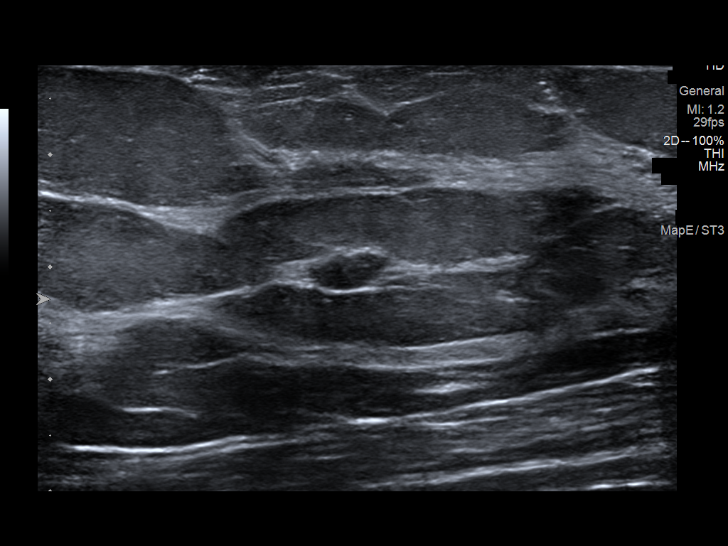
[im 4/10]
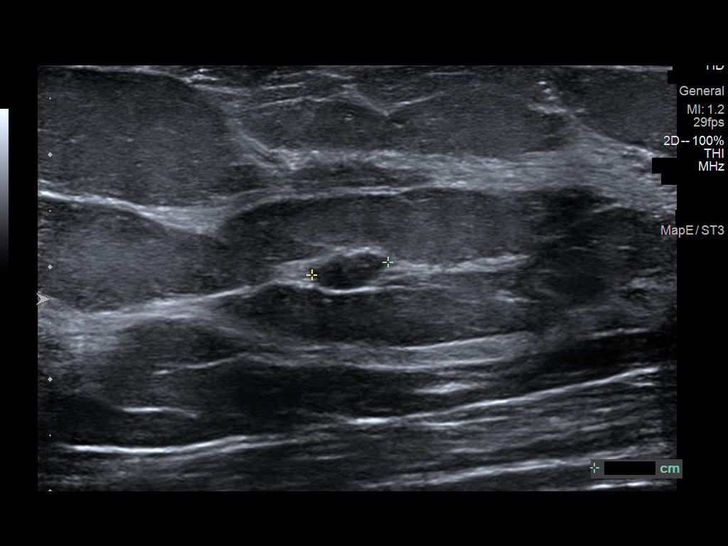
[im 5/10]
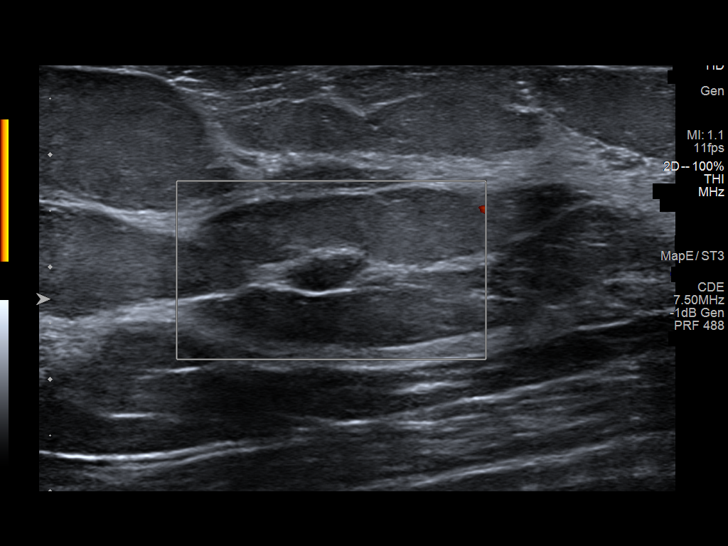
[im 6/10]
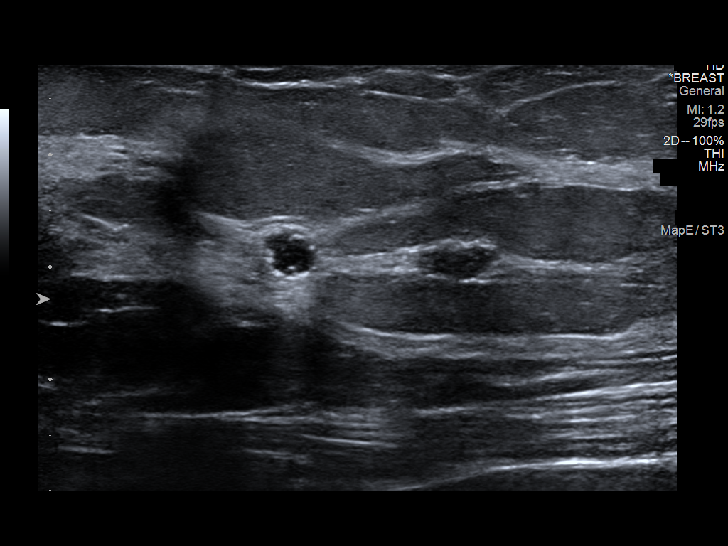
[im 7/10]
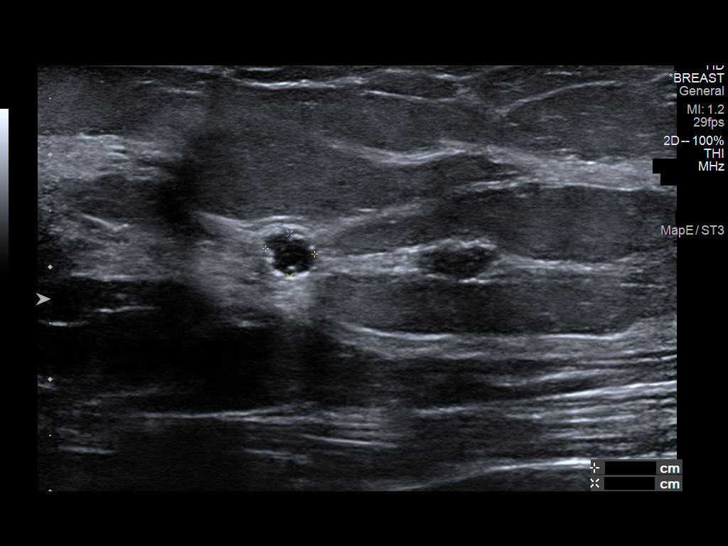
[im 8/10]
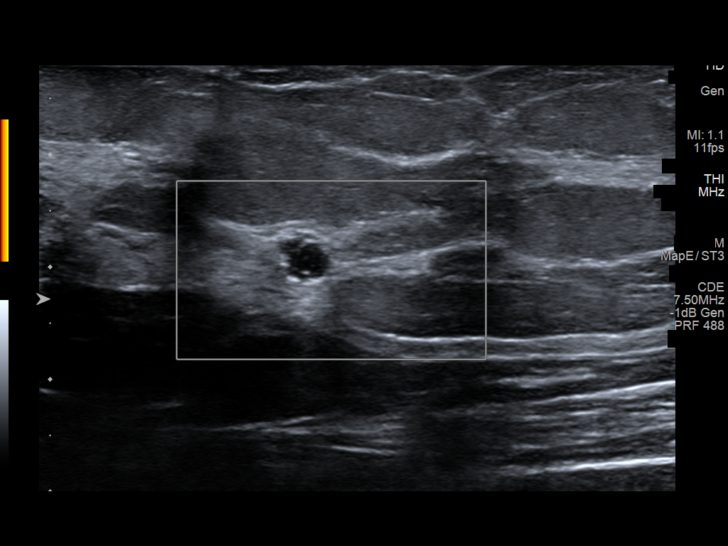
[im 9/10]
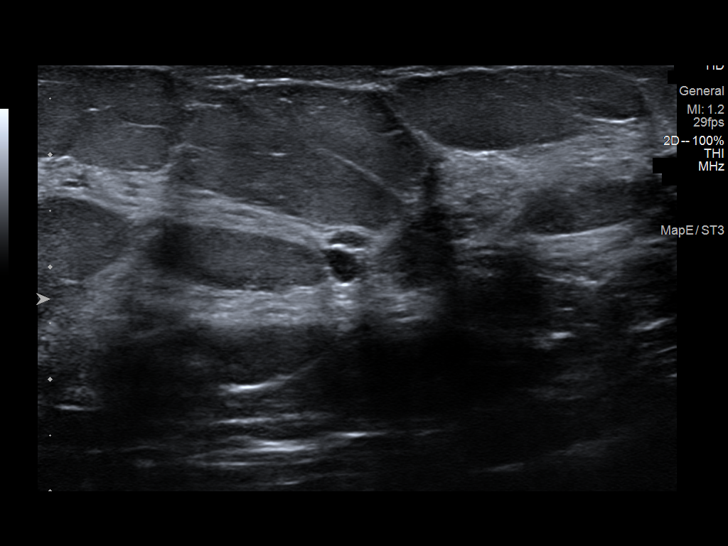
[im 10/10]
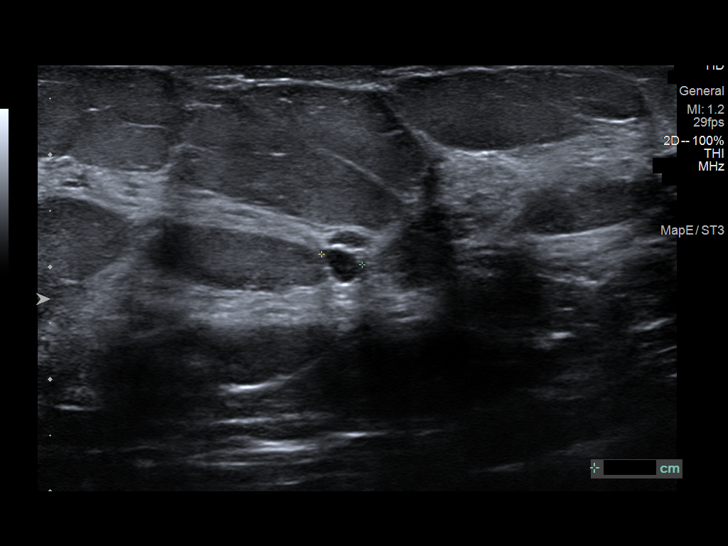

[10 of 10 positions shown; findings below may reference images not displayed]

FINDINGS: On physical exam, no mass is palpated in the lateral aspect of the
left breast.

Targeted ultrasound is performed, showing a well-circumscribed
hypoechoic mass in left breast at [DATE] 3 cm from the nipple
measuring 7 x 3 x 7 mm. There is hypoechoic mass with increased
through transmission in left breast at [DATE] 2 cm from the nipple
measuring 4 x 4 x 4 mm likely a complex cyst.
IMPRESSION: Probable fibroadenoma and cyst in left breast.

RECOMMENDATION:
Short-term interval follow-up left breast ultrasound in 6 months is
recommended.

I have discussed the findings and recommendations with the patient.
Results were also provided in writing at the conclusion of the
visit. If applicable, a reminder letter will be sent to the patient
regarding the next appointment.

BI-RADS CATEGORY  3: Probably benign.

## 2018-12-24 ENCOUNTER — Encounter: Payer: Self-pay | Admitting: Internal Medicine

## 2019-01-04 ENCOUNTER — Other Ambulatory Visit: Payer: Self-pay

## 2019-01-04 ENCOUNTER — Other Ambulatory Visit: Payer: BLUE CROSS/BLUE SHIELD

## 2019-01-04 DIAGNOSIS — R6889 Other general symptoms and signs: Secondary | ICD-10-CM | POA: Diagnosis not present

## 2019-01-04 DIAGNOSIS — Z20822 Contact with and (suspected) exposure to covid-19: Secondary | ICD-10-CM

## 2019-01-08 LAB — NOVEL CORONAVIRUS, NAA: SARS-CoV-2, NAA: NOT DETECTED

## 2019-02-16 LAB — HM PAP SMEAR

## 2019-02-22 ENCOUNTER — Encounter: Payer: Self-pay | Admitting: General Practice

## 2019-03-04 DIAGNOSIS — Z6829 Body mass index (BMI) 29.0-29.9, adult: Secondary | ICD-10-CM | POA: Diagnosis not present

## 2019-03-04 DIAGNOSIS — Z01419 Encounter for gynecological examination (general) (routine) without abnormal findings: Secondary | ICD-10-CM | POA: Diagnosis not present

## 2019-03-29 ENCOUNTER — Other Ambulatory Visit: Payer: Self-pay | Admitting: Family

## 2019-03-29 MED ORDER — SUMATRIPTAN SUCCINATE 100 MG PO TABS: 100.0000 mg | ORAL_TABLET | Freq: Once | ORAL | 1 refills | Status: DC

## 2019-05-10 DIAGNOSIS — H5213 Myopia, bilateral: Secondary | ICD-10-CM | POA: Diagnosis not present

## 2019-05-24 ENCOUNTER — Other Ambulatory Visit: Payer: Self-pay | Admitting: Obstetrics and Gynecology

## 2019-05-24 DIAGNOSIS — Z6828 Body mass index (BMI) 28.0-28.9, adult: Secondary | ICD-10-CM | POA: Diagnosis not present

## 2019-05-24 DIAGNOSIS — Z803 Family history of malignant neoplasm of breast: Secondary | ICD-10-CM | POA: Diagnosis not present

## 2019-05-24 DIAGNOSIS — N644 Mastodynia: Secondary | ICD-10-CM | POA: Diagnosis not present

## 2019-05-24 DIAGNOSIS — Z801 Family history of malignant neoplasm of trachea, bronchus and lung: Secondary | ICD-10-CM | POA: Diagnosis not present

## 2019-05-24 DIAGNOSIS — Z808 Family history of malignant neoplasm of other organs or systems: Secondary | ICD-10-CM | POA: Diagnosis not present

## 2019-05-24 DIAGNOSIS — Z800 Family history of malignant neoplasm of digestive organs: Secondary | ICD-10-CM | POA: Diagnosis not present

## 2019-06-02 ENCOUNTER — Other Ambulatory Visit: Payer: Self-pay

## 2019-06-02 ENCOUNTER — Ambulatory Visit
Admission: RE | Admit: 2019-06-02 | Discharge: 2019-06-02 | Disposition: A | Discharge status: Home / Self Care | Payer: BC Managed Care – PPO | Source: Ambulatory Visit | Attending: Obstetrics and Gynecology | Admitting: Obstetrics and Gynecology

## 2019-06-02 DIAGNOSIS — N644 Mastodynia: Secondary | ICD-10-CM | POA: Diagnosis not present

## 2019-06-02 DIAGNOSIS — R922 Inconclusive mammogram: Secondary | ICD-10-CM | POA: Diagnosis not present

## 2019-06-23 ENCOUNTER — Other Ambulatory Visit: Payer: Self-pay | Admitting: Family Medicine

## 2019-06-26 ENCOUNTER — Encounter: Payer: Self-pay | Admitting: Family Medicine

## 2019-06-27 MED ORDER — PROPRANOLOL HCL 20 MG PO TABS: 20.0000 mg | ORAL_TABLET | Freq: Two times a day (BID) | ORAL | 0 refills | Status: DC

## 2019-06-28 DIAGNOSIS — Z7183 Encounter for nonprocreative genetic counseling: Secondary | ICD-10-CM | POA: Diagnosis not present

## 2019-06-28 DIAGNOSIS — Z800 Family history of malignant neoplasm of digestive organs: Secondary | ICD-10-CM | POA: Diagnosis not present

## 2019-07-21 ENCOUNTER — Encounter: Payer: Self-pay | Admitting: Family Medicine

## 2019-07-21 ENCOUNTER — Other Ambulatory Visit: Payer: Self-pay | Admitting: Family Medicine

## 2019-07-21 NOTE — Telephone Encounter (Signed)
Medication filled to pharmacy as requested.   

## 2019-08-09 NOTE — Telephone Encounter (Signed)
Error

## 2019-08-10 ENCOUNTER — Encounter: Payer: Self-pay | Admitting: Family Medicine

## 2019-08-10 ENCOUNTER — Other Ambulatory Visit: Payer: Self-pay

## 2019-08-10 ENCOUNTER — Ambulatory Visit (INDEPENDENT_AMBULATORY_CARE_PROVIDER_SITE_OTHER): Payer: BC Managed Care – PPO | Admitting: Family Medicine

## 2019-08-10 VITALS — Ht 64.0 in | Wt 166.0 lb

## 2019-08-10 DIAGNOSIS — E663 Overweight: Secondary | ICD-10-CM | POA: Diagnosis not present

## 2019-08-10 DIAGNOSIS — E559 Vitamin D deficiency, unspecified: Secondary | ICD-10-CM

## 2019-08-10 DIAGNOSIS — Z0000 Encounter for general adult medical examination without abnormal findings: Secondary | ICD-10-CM

## 2019-08-10 NOTE — Progress Notes (Signed)
I have discussed the procedure for the virtual visit with the patient who has given consent to proceed with assessment and treatment.   Pt unable to obtain vitals.   Beverly Gray, CMA     

## 2019-08-10 NOTE — Progress Notes (Signed)
   Virtual Visit via Video   I connected with patient on 08/10/19 at  1:30 PM EST by a video enabled telemedicine application and verified that I am speaking with the correct person using two identifiers.  Location patient: Home Location provider: Acupuncturist, Office Persons participating in the virtual visit: Patient, Provider, [place] (Jess B)  I discussed the limitations of evaluation and management by telemedicine and the availability of in person appointments. The patient expressed understanding and agreed to proceed.  Subjective:   HPI:   CPE- UTD on pap, mammo, Tdap, flu.  Got COVID vaccines in January.  Colonoscopy scheduled due to family hx.  ROS:  Patient reports no vision/ hearing changes, adenopathy,fever, weight change,  persistant/recurrent hoarseness , swallowing issues, chest pain, palpitations, edema, persistant/recurrent cough, hemoptysis, dyspnea (rest/exertional/paroxysmal nocturnal), gastrointestinal bleeding (melena, rectal bleeding), abdominal pain, significant heartburn, bowel changes, GU symptoms (dysuria, hematuria, incontinence), Gyn symptoms (abnormal  bleeding, pain),  syncope, focal weakness, memory loss, numbness & tingling, skin/hair/nail changes, abnormal bruising or bleeding, anxiety, or depression.   Patient Active Problem List   Diagnosis Date Noted  . Vitamin D deficiency 06/18/2018  . Physical exam 06/11/2016  . Traumatic hematoma of buttock 01/23/2016  . Migraine with aura and without status migrainosus 11/09/2015  . Pain in joint, ankle and foot 12/07/2013    Social History   Tobacco Use  . Smoking status: Never Smoker  . Smokeless tobacco: Never Used  Substance Use Topics  . Alcohol use: Yes    Comment: Occasionally    Current Outpatient Medications:  .  cetirizine (ZYRTEC) 10 MG tablet, Take 10 mg by mouth daily as needed. , Disp: , Rfl:  .  Magnesium 400 MG TABS, Take by mouth., Disp: , Rfl:  .  Multiple Vitamins-Minerals  (MULTIVITAMIN ADULT PO), Take by mouth., Disp: , Rfl:  .  propranolol (INDERAL) 20 MG tablet, TAKE 1 TABLET BY MOUTH TWICE A DAY, Disp: 60 tablet, Rfl: 3 .  saccharomyces boulardii (FLORASTOR) 250 MG capsule, Take 250 mg by mouth 2 (two) times daily. Take for a month, starting 08/24/2018, Disp: , Rfl:  .  SUMAtriptan (IMITREX) 100 MG tablet, TAKE 1 TABLET BY MOUTH ONCE FOR 1 DOSE, MAY REPEAT IN 2 HOURS IF HEADACHE PERSISTS OR RECURS, Disp: 10 tablet, Rfl: 6  Allergies  Allergen Reactions  . Bee Venom Rash and Swelling    Objective:   Ht 5\' 4"  (1.626 m)   Wt 166 lb (75.3 kg)   BMI 28.49 kg/m  AAOx3, NAD NCAT, EOMI No obvious CN deficits Coloring WNL Pt is able to speak clearly, coherently without shortness of breath or increased work of breathing.  Thought process is linear.  Mood is appropriate.   Assessment and Plan:   CPE- UTD on GYN, immunizations, and colonoscopy scheduled.  Pt's limited PE WNL.  Check labs.  Anticipatory guidance provided.    Annye Asa, MD 08/10/2019

## 2019-08-15 ENCOUNTER — Telehealth: Payer: Self-pay | Admitting: Family Medicine

## 2019-08-15 NOTE — Telephone Encounter (Signed)
LM asking pt to call back to schedule a 1 yr cpe after 08/09/2020.

## 2019-08-26 ENCOUNTER — Ambulatory Visit (AMBULATORY_SURGERY_CENTER): Payer: Self-pay | Admitting: *Deleted

## 2019-08-26 ENCOUNTER — Other Ambulatory Visit: Payer: Self-pay

## 2019-08-26 ENCOUNTER — Other Ambulatory Visit (INDEPENDENT_AMBULATORY_CARE_PROVIDER_SITE_OTHER): Payer: BC Managed Care – PPO

## 2019-08-26 VITALS — Temp 97.7°F | Ht 64.0 in | Wt 166.0 lb

## 2019-08-26 DIAGNOSIS — E559 Vitamin D deficiency, unspecified: Secondary | ICD-10-CM

## 2019-08-26 DIAGNOSIS — Z800 Family history of malignant neoplasm of digestive organs: Secondary | ICD-10-CM

## 2019-08-26 DIAGNOSIS — E663 Overweight: Secondary | ICD-10-CM | POA: Diagnosis not present

## 2019-08-26 LAB — BASIC METABOLIC PANEL
BUN: 11 mg/dL (ref 6–23)
CO2: 27 mEq/L (ref 19–32)
Calcium: 9.5 mg/dL (ref 8.4–10.5)
Chloride: 102 mEq/L (ref 96–112)
Creatinine, Ser: 0.72 mg/dL (ref 0.40–1.20)
GFR: 88.77 mL/min (ref >60.00)
Glucose, Bld: 88 mg/dL (ref 70–99)
Potassium: 4.1 mEq/L (ref 3.5–5.1)
Sodium: 137 mEq/L (ref 135–145)

## 2019-08-26 LAB — CBC WITH DIFFERENTIAL/PLATELET
Basophils Absolute: 0.1 10*3/uL (ref 0.0–0.1)
Basophils Relative: 1.2 % (ref 0.0–3.0)
Eosinophils Absolute: 0.2 10*3/uL (ref 0.0–0.7)
Eosinophils Relative: 2.9 % (ref 0.0–5.0)
HCT: 41.6 % (ref 36.0–46.0)
Hemoglobin: 13.9 g/dL (ref 12.0–15.0)
Lymphocytes Relative: 42.3 % (ref 12.0–46.0)
Lymphs Abs: 2.5 10*3/uL (ref 0.7–4.0)
MCHC: 33.4 g/dL (ref 30.0–36.0)
MCV: 92.2 fl (ref 78.0–100.0)
Monocytes Absolute: 0.6 10*3/uL (ref 0.1–1.0)
Monocytes Relative: 10 % (ref 3.0–12.0)
Neutro Abs: 2.5 10*3/uL (ref 1.4–7.7)
Neutrophils Relative %: 43.6 % (ref 43.0–77.0)
Platelets: 252 10*3/uL (ref 150.0–400.0)
RBC: 4.52 Mil/uL (ref 3.87–5.11)
RDW: 11.9 % (ref 11.5–15.5)
WBC: 5.8 10*3/uL (ref 4.0–10.5)

## 2019-08-26 LAB — HEPATIC FUNCTION PANEL
ALT: 20 U/L (ref 0–35)
AST: 19 U/L (ref 0–37)
Albumin: 4.4 g/dL (ref 3.5–5.2)
Alkaline Phosphatase: 68 U/L (ref 39–117)
Bilirubin, Direct: 0.1 mg/dL (ref 0.0–0.3)
Total Bilirubin: 0.6 mg/dL (ref 0.2–1.2)
Total Protein: 7.6 g/dL (ref 6.0–8.3)

## 2019-08-26 LAB — LIPID PANEL
Cholesterol: 178 mg/dL (ref 0–200)
HDL: 56.3 mg/dL (ref >39.00)
LDL Cholesterol: 103 mg/dL — ABNORMAL HIGH (ref 0–99)
NonHDL: 121.71
Total CHOL/HDL Ratio: 3
Triglycerides: 95 mg/dL (ref 0.0–149.0)
VLDL: 19 mg/dL (ref 0.0–40.0)

## 2019-08-26 LAB — VITAMIN D 25 HYDROXY (VIT D DEFICIENCY, FRACTURES): VITD: 37.44 ng/mL (ref 30.00–100.00)

## 2019-08-26 LAB — TSH: TSH: 0.93 u[IU]/mL (ref 0.35–4.50)

## 2019-08-26 NOTE — Progress Notes (Signed)

## 2019-09-07 ENCOUNTER — Encounter: Payer: Self-pay | Admitting: Internal Medicine

## 2019-09-07 ENCOUNTER — Other Ambulatory Visit: Payer: Self-pay | Admitting: Obstetrics and Gynecology

## 2019-09-07 DIAGNOSIS — Z1231 Encounter for screening mammogram for malignant neoplasm of breast: Secondary | ICD-10-CM

## 2019-09-09 ENCOUNTER — Encounter: Payer: BC Managed Care – PPO | Admitting: Internal Medicine

## 2019-09-19 ENCOUNTER — Encounter: Payer: BC Managed Care – PPO | Admitting: Internal Medicine

## 2019-09-20 ENCOUNTER — Other Ambulatory Visit: Payer: Self-pay

## 2019-09-20 ENCOUNTER — Ambulatory Visit
Admission: RE | Admit: 2019-09-20 | Discharge: 2019-09-20 | Disposition: A | Discharge status: Home / Self Care | Payer: BC Managed Care – PPO | Source: Ambulatory Visit

## 2019-09-20 DIAGNOSIS — Z1231 Encounter for screening mammogram for malignant neoplasm of breast: Secondary | ICD-10-CM | POA: Diagnosis not present

## 2019-09-22 ENCOUNTER — Other Ambulatory Visit: Payer: Self-pay | Admitting: Obstetrics and Gynecology

## 2019-09-22 DIAGNOSIS — R928 Other abnormal and inconclusive findings on diagnostic imaging of breast: Secondary | ICD-10-CM

## 2019-09-26 ENCOUNTER — Ambulatory Visit
Admission: RE | Admit: 2019-09-26 | Discharge: 2019-09-26 | Disposition: A | Discharge status: Home / Self Care | Payer: BC Managed Care – PPO | Source: Ambulatory Visit | Attending: Obstetrics and Gynecology | Admitting: Obstetrics and Gynecology

## 2019-09-26 ENCOUNTER — Other Ambulatory Visit: Payer: Self-pay

## 2019-09-26 DIAGNOSIS — R928 Other abnormal and inconclusive findings on diagnostic imaging of breast: Secondary | ICD-10-CM

## 2019-09-26 DIAGNOSIS — N6011 Diffuse cystic mastopathy of right breast: Secondary | ICD-10-CM | POA: Diagnosis not present

## 2019-10-15 ENCOUNTER — Other Ambulatory Visit: Payer: Self-pay | Admitting: Family Medicine

## 2019-10-28 ENCOUNTER — Other Ambulatory Visit: Payer: Self-pay

## 2019-10-28 ENCOUNTER — Ambulatory Visit (AMBULATORY_SURGERY_CENTER): Payer: BC Managed Care – PPO | Admitting: Internal Medicine

## 2019-10-28 ENCOUNTER — Encounter: Payer: Self-pay | Admitting: Internal Medicine

## 2019-10-28 VITALS — BP 100/69 | HR 80 | Temp 97.3°F | Resp 18 | Ht 64.0 in | Wt 166.0 lb

## 2019-10-28 DIAGNOSIS — Z800 Family history of malignant neoplasm of digestive organs: Secondary | ICD-10-CM | POA: Diagnosis not present

## 2019-10-28 DIAGNOSIS — Z1211 Encounter for screening for malignant neoplasm of colon: Secondary | ICD-10-CM | POA: Diagnosis not present

## 2019-10-28 MED ORDER — SODIUM CHLORIDE 0.9 % IV SOLN: 500.0000 mL | Freq: Once | INTRAVENOUS | Status: DC

## 2019-10-28 NOTE — Patient Instructions (Addendum)
The colonoscopy was normal.  Your next routine colonoscopy should be in 5 years - 2026.  I appreciate the opportunity to care for you. Laterrance Nauta E. Kacee Sukhu, MD, FACG  YOU HAD AN ENDOSCOPIC PROCEDURE TODAY AT THE [facility]:   Refer to the procedure report that was given to you for any specific questions about what was found during the examination.  If the procedure report does not answer your questions, please call your gastroenterologist to clarify.  If you requested that your care partner not be given the details of your procedure findings, then the procedure report has been included in a sealed envelope for you to review at your convenience later.  YOU SHOULD EXPECT: Some feelings of bloating in the abdomen. Passage of more gas than usual.  Walking can help get rid of the air that was put into your GI tract during the procedure and reduce the bloating. If you had a lower endoscopy (such as a colonoscopy or flexible sigmoidoscopy) you may notice spotting of blood in your stool or on the toilet paper. If you underwent a bowel prep for your procedure, you may not have a normal bowel movement for a few days.  Please Note:  You might notice some irritation and congestion in your nose or some drainage.  This is from the oxygen used during your procedure.  There is no need for concern and it should clear up in a day or so.  SYMPTOMS TO REPORT IMMEDIATELY:   Following lower endoscopy (colonoscopy or flexible sigmoidoscopy):  Excessive amounts of blood in the stool  Significant tenderness or worsening of abdominal pains  Swelling of the abdomen that is new, acute  Fever of 100F or higher  For urgent or emergent issues, a gastroenterologist can be reached at any hour by calling [phone]. Do not use MyChart messaging for urgent concerns.    DIET:  We do recommend a small meal at first, but then you may proceed to your regular diet.  Drink plenty of fluids but you should avoid  alcoholic beverages for 24 hours.  ACTIVITY:  You should plan to take it easy for the rest of today and you should NOT DRIVE or use heavy machinery until tomorrow (because of the sedation medicines used during the test).    FOLLOW UP: Our staff will call the number listed on your records 48-72 hours following your procedure to check on you and address any questions or concerns that you may have regarding the information given to you following your procedure. If we do not reach you, we will leave a message.  We will attempt to reach you two times.  During this call, we will ask if you have developed any symptoms of COVID 19. If you develop any symptoms (ie: fever, flu-like symptoms, shortness of breath, cough etc.) before then, please call [phone].  If you test positive for Covid 19 in the 2 weeks post procedure, please call and report this information to us.    If any biopsies were taken you will be contacted by phone or by letter within the next 1-3 weeks.  Please call us at [phone] if you have not heard about the biopsies in 3 weeks.    SIGNATURES/CONFIDENTIALITY: You and/or your care partner have signed paperwork which will be entered into your electronic medical record.  These signatures attest to the fact that that the information above on your After Visit Summary has been reviewed and is understood.  Full responsibility of   responsibility of the confidentiality of this discharge information lies with you and/or your care-partner.

## 2019-10-28 NOTE — Op Note (Signed)
[facility] Patient Name: Beverly Gray Procedure Date: 10/28/2019 2:04 PM MRN: [id number] Endoscopist: Gatha Mayer , MD Age: 44 Referring MD:  Date of Birth: [birth year] Gender: Female Account #: [id number] Procedure:                Colonoscopy Indications:              Screening in patient at increased risk: Family                            history of 1st-degree relative with colorectal                            cancer, Screening in patient at increased risk:                            Colorectal cancer in father before age 42 Medicines:                Propofol per Anesthesia, Monitored Anesthesia Care Procedure:                Pre-Anesthesia Assessment:                           - Prior to the procedure, a History and Physical                            was performed, and patient medications and                            allergies were reviewed. The patient's tolerance of                            previous anesthesia was also reviewed. The risks                            and benefits of the procedure and the sedation                            options and risks were discussed with the patient.                            All questions were answered, and informed consent                            was obtained. Prior Anticoagulants: The patient has                            taken no previous anticoagulant or antiplatelet                            agents. ASA Grade Assessment: II - A patient with                            mild systemic disease. After reviewing the risks  and benefits, the patient was deemed in                            satisfactory condition to undergo the procedure.                           After obtaining informed consent, the colonoscope                            was passed under direct vision. Throughout the                            procedure, the patient's blood pressure, pulse, and   oxygen saturations were monitored continuously. The                            Colonoscope was introduced through the anus and                            advanced to the the terminal ileum, with                            identification of the appendiceal orifice and IC                            valve. The terminal ileum, ileocecal valve,                            appendiceal orifice, and rectum were photographed.                            The quality of the bowel preparation was excellent.                            The colonoscopy was performed without difficulty.                            The patient tolerated the procedure well. The bowel                            preparation used was Miralax via split dose                            instruction. Scope In: 2:12:55 PM Scope Out: 2:25:20 PM Scope Withdrawal Time: 0 hours 9 minutes 10 seconds  Total Procedure Duration: 0 hours 12 minutes 25 seconds  Findings:                 The perianal and digital rectal examinations were                            normal.                           The colon (entire examined portion) appeared normal.  No additional abnormalities were found on                            retroflexion. Complications:            No immediate complications. Estimated blood loss:                            None. Estimated Blood Loss:     Estimated blood loss: none. Recommendation:           - Repeat colonoscopy in 5 years for screening                            purposes.                           - Patient has a contact number available for                            emergencies. The signs and symptoms of potential                            delayed complications were discussed with the                            patient. Return to normal activities tomorrow.                            Written discharge instructions were provided to the                            patient.                            - Resume previous diet.                           - Continue present medications. Gatha Mayer, MD 10/28/2019 2:35:55 PM This report has been signed electronically.

## 2019-10-28 NOTE — Progress Notes (Signed)
Pt's states no medical or surgical changes since previsit or office visit.   AB IV, DT vitals and LC temp.

## 2019-10-28 NOTE — Progress Notes (Signed)
pt tolerated well. VSS. awake and to recovery. Report given to RN.  

## 2019-11-01 ENCOUNTER — Telehealth: Payer: Self-pay | Admitting: *Deleted

## 2019-11-01 NOTE — Telephone Encounter (Signed)
  Follow up Call-  Call back number 10/28/2019  Post procedure Call Back phone  # [phone]  Permission to leave phone message Yes  Some recent data might be hidden     Patient questions:  Do you have a fever, pain , or abdominal swelling? No. Pain Score  0 *  Have you tolerated food without any problems? Yes.    Have you been able to return to your normal activities? Yes.    Do you have any questions about your discharge instructions: Diet   No. Medications  No. Follow up visit  No.  Do you have questions or concerns about your Care? No.  Actions: * If pain score is 4 or above: No action needed, pain <4.  1. Have you developed a fever since your procedure? no  2.   Have you had an respiratory symptoms (SOB or cough) since your procedure? no  3.   Have you tested positive for COVID 19 since your procedure no  4.   Have you had any family members/close contacts diagnosed with the COVID 19 since your procedure?  no   If yes to any of these questions please route to Joylene John, RN and Erenest Rasher, RN

## 2020-01-31 ENCOUNTER — Encounter: Payer: Self-pay | Admitting: Family Medicine

## 2020-02-01 ENCOUNTER — Encounter: Payer: Self-pay | Admitting: General Practice

## 2020-02-02 DIAGNOSIS — Z111 Encounter for screening for respiratory tuberculosis: Secondary | ICD-10-CM | POA: Diagnosis not present

## 2020-02-07 ENCOUNTER — Encounter: Payer: Self-pay | Admitting: Family Medicine

## 2020-02-07 ENCOUNTER — Other Ambulatory Visit: Payer: Self-pay

## 2020-02-07 ENCOUNTER — Telehealth (INDEPENDENT_AMBULATORY_CARE_PROVIDER_SITE_OTHER): Payer: BC Managed Care – PPO | Admitting: Family Medicine

## 2020-02-07 DIAGNOSIS — F900 Attention-deficit hyperactivity disorder, predominantly inattentive type: Secondary | ICD-10-CM

## 2020-02-07 DIAGNOSIS — F419 Anxiety disorder, unspecified: Secondary | ICD-10-CM | POA: Diagnosis not present

## 2020-02-07 MED ORDER — ATOMOXETINE HCL 40 MG PO CAPS: 40.0000 mg | ORAL_CAPSULE | Freq: Every day | ORAL | 3 refills | Status: DC

## 2020-02-07 NOTE — Progress Notes (Signed)
I have discussed the procedure for the virtual visit with the patient who has given consent to proceed with assessment and treatment.   Pt unable to obtain vitals.   Armya Westerhoff L Donnivan Villena, CMA     

## 2020-02-07 NOTE — Progress Notes (Signed)
Virtual Visit via Video   I connected with patient on 02/07/20 at 10:30 AM EDT by a video enabled telemedicine application and verified that I am speaking with the correct person using two identifiers.  Location patient: Home Location provider: [facility], Office Persons participating in the virtual visit: Patient, Provider, [place] (Jess B)  I discussed the limitations of evaluation and management by telemedicine and the availability of in person appointments. The patient expressed understanding and agreed to proceed.  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  COVID is ramping up- pt works in [employer].  Pt reports being very scattered, 'all over the place'.  She is feeling overwhelmed which has caused her to compulsively check on her to do lists and piles.  Pt tends to feel this way when she is spread to thin.  Has difficulty sleeping at night due to racing thoughts.    ROS:   See pertinent positives and negatives per HPI.  Patient Active Problem List   Diagnosis Date Noted  . Vitamin D deficiency 06/18/2018  . Physical exam 06/11/2016  . Traumatic hematoma of buttock 01/23/2016  . Migraine with aura and without status migrainosus 11/09/2015  . Pain in joint, ankle and foot 12/07/2013    Social History   Tobacco Use  . Smoking status: Never Smoker  . Smokeless tobacco: Never Used  Substance Use Topics  . Alcohol use: Yes    Comment: Occasionally    Current Outpatient Medications:  .  BIOTIN PO, Take 1,000 mcg by mouth daily., Disp: , Rfl:  .  cetirizine (ZYRTEC) 10 MG tablet, Take 10 mg by mouth daily as needed. , Disp: , Rfl:  .  cholecalciferol (VITAMIN D3) 25 MCG (1000 UNIT) tablet, Take 1,000 Units by mouth daily., Disp: , Rfl:  .  Magnesium 400 MG TABS, Take by mouth., Disp: , Rfl:  .  Multiple Vitamins-Minerals (MULTIVITAMIN ADULT PO), Take by mouth., Disp: , Rfl:  .  propranolol (INDERAL) 20 MG tablet, TAKE  1 TABLET BY MOUTH TWICE A DAY (Patient not taking: Reported on 10/28/2019), Disp: 180 tablet, Rfl: 1 .  SUMAtriptan (IMITREX) 100 MG tablet, TAKE 1 TABLET BY MOUTH ONCE FOR 1 DOSE, MAY REPEAT IN 2 HOURS IF HEADACHE PERSISTS OR RECURS, Disp: 10 tablet, Rfl: 6  Allergies  Allergen Reactions  . Bee Venom Rash and Swelling    Objective:   There were no vitals taken for this visit.  AAOx3, NAD NCAT, EOMI No obvious CN deficits Coloring WNL Pt is able to speak clearly, coherently without shortness of breath or increased work of breathing.  Thought process is linear.  Mood is appropriate.   Assessment and Plan:   Anxiety- new.  Pt reports feeling overwhelmed at home and at work.  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  Discussed Wellbutrin vs Strattera to improve both focus and anxiety.  Pt reports she is not the best pill taker and since Wellbutrin starts at 75mg  BID, I worry about compliance.  Will start w/ Strattera and monitor for improvement.  Pt expressed understanding and is in agreement w/ plan.   ADHD- new.  pt reports she has been having difficulty keeping on task and managing her very long to-do lists.  She feels overwhelmed and scattered.  Suspect this is bc she has so much going on right now and the underlying anxiety is worsening  her sxs.  Will start Strattera and monitor for improvement.  Pt expressed understanding and is in agreement w/ plan.    Annye Asa, MD 02/07/2020

## 2020-02-09 ENCOUNTER — Encounter: Payer: Self-pay | Admitting: General Practice

## 2020-02-09 ENCOUNTER — Telehealth: Payer: BC Managed Care – PPO | Admitting: Emergency Medicine

## 2020-02-09 DIAGNOSIS — R300 Dysuria: Secondary | ICD-10-CM

## 2020-02-09 DIAGNOSIS — F909 Attention-deficit hyperactivity disorder, unspecified type: Secondary | ICD-10-CM | POA: Insufficient documentation

## 2020-02-09 MED ORDER — NITROFURANTOIN MONOHYD MACRO 100 MG PO CAPS: 100.0000 mg | ORAL_CAPSULE | Freq: Two times a day (BID) | ORAL | 0 refills | Status: DC

## 2020-02-09 NOTE — Progress Notes (Signed)
We are sorry that you are not feeling well.  Here is how we plan to help!  Based on what you shared with me it looks like you most likely have a simple urinary tract infection.  A UTI (Urinary Tract Infection) is a bacterial infection of the bladder.  Most cases of urinary tract infections are simple to treat but a key part of your care is to encourage you to drink plenty of fluids and watch your symptoms carefully.  I have prescribed MacroBid 100 mg twice a day for 5 days.  Your symptoms should gradually improve. Call us if the burning in your urine worsens, you develop worsening fever, back pain or pelvic pain or if your symptoms do not resolve after completing the antibiotic.  Urinary tract infections can be prevented by drinking plenty of water to keep your body hydrated.  Also be sure when you wipe, wipe from front to back and don't hold it in!  If possible, empty your bladder every 4 hours.  Your e-visit answers were reviewed by a board certified advanced clinical practitioner to complete your personal care plan.  Depending on the condition, your plan could have included both over the counter or prescription medications.  If there is a problem please reply  once you have received a response from your provider.  Your safety is important to us.  If you have drug allergies check your prescription carefully.    You can use MyChart to ask questions about today's visit, request a non-urgent call back, or ask for a work or school excuse for 24 hours related to this e-Visit. If it has been greater than 24 hours you will need to follow up with your provider, or enter a new e-Visit to address those concerns.   You will get an e-mail in the next two days asking about your experience.  I hope that your e-visit has been valuable and will speed your recovery. Thank you for using e-visits.   **Please do not respond to this message unless you have follow up questions.** Greater than 5 but less than 10  minutes spent researching, coordinating, and implementing care for this patient today  

## 2020-03-03 ENCOUNTER — Other Ambulatory Visit: Payer: Self-pay | Admitting: Family Medicine

## 2020-03-06 NOTE — Telephone Encounter (Signed)
Please advise if ok for #90?

## 2020-03-27 ENCOUNTER — Encounter: Payer: Self-pay | Admitting: Family Medicine

## 2020-03-27 DIAGNOSIS — Z0184 Encounter for antibody response examination: Secondary | ICD-10-CM

## 2020-03-27 DIAGNOSIS — Z111 Encounter for screening for respiratory tuberculosis: Secondary | ICD-10-CM

## 2020-03-28 ENCOUNTER — Other Ambulatory Visit: Payer: Self-pay

## 2020-03-28 ENCOUNTER — Ambulatory Visit (INDEPENDENT_AMBULATORY_CARE_PROVIDER_SITE_OTHER): Payer: BC Managed Care – PPO

## 2020-03-28 DIAGNOSIS — Z111 Encounter for screening for respiratory tuberculosis: Secondary | ICD-10-CM

## 2020-03-28 DIAGNOSIS — Z0184 Encounter for antibody response examination: Secondary | ICD-10-CM | POA: Diagnosis not present

## 2020-03-29 ENCOUNTER — Other Ambulatory Visit: Payer: Self-pay | Admitting: Family Medicine

## 2020-04-05 LAB — QUANTIFERON-TB GOLD PLUS
Mitogen-NIL: >10 IU/mL
NIL: 0.04 IU/mL
QuantiFERON-TB Gold Plus: NEGATIVE
TB1-NIL: 0.01 IU/mL
TB2-NIL: 0 IU/mL

## 2020-04-05 LAB — POLIOVIRUS (1,3) ABS, NEUTRALIZ.
Polio 1 Titer: >1:128 {titer}
Polio 3 Titer: >1:128 {titer}

## 2020-04-06 ENCOUNTER — Encounter: Payer: Self-pay | Admitting: Family Medicine

## 2020-06-19 DIAGNOSIS — Z124 Encounter for screening for malignant neoplasm of cervix: Secondary | ICD-10-CM | POA: Diagnosis not present

## 2020-06-19 DIAGNOSIS — Z01419 Encounter for gynecological examination (general) (routine) without abnormal findings: Secondary | ICD-10-CM | POA: Diagnosis not present

## 2020-07-03 LAB — HM PAP SMEAR
HM Pap smear: NORMAL
HPV, high-risk: NEGATIVE

## 2020-10-02 ENCOUNTER — Other Ambulatory Visit: Payer: Self-pay | Admitting: Obstetrics and Gynecology

## 2020-10-02 DIAGNOSIS — R928 Other abnormal and inconclusive findings on diagnostic imaging of breast: Secondary | ICD-10-CM

## 2020-10-05 ENCOUNTER — Other Ambulatory Visit: Payer: Self-pay

## 2020-10-05 ENCOUNTER — Ambulatory Visit
Admission: RE | Admit: 2020-10-05 | Discharge: 2020-10-05 | Disposition: A | Discharge status: Home / Self Care | Payer: BC Managed Care – PPO | Source: Ambulatory Visit | Attending: Obstetrics and Gynecology | Admitting: Obstetrics and Gynecology

## 2020-10-05 ENCOUNTER — Ambulatory Visit
Admission: RE | Admit: 2020-10-05 | Discharge: 2020-10-05 | Disposition: A | Discharge status: Home / Self Care | Payer: 59 | Source: Ambulatory Visit | Attending: Obstetrics and Gynecology | Admitting: Obstetrics and Gynecology

## 2020-10-05 DIAGNOSIS — R928 Other abnormal and inconclusive findings on diagnostic imaging of breast: Secondary | ICD-10-CM

## 2020-10-08 ENCOUNTER — Encounter: Payer: Self-pay | Admitting: Family Medicine

## 2020-10-18 IMAGING — US US BREAST*R* LIMITED INC AXILLA
1 series · 2 of 2 positions shown · non-contrast
Comparison: Previous exam(s).
COMPARISON: Previous exam(s).

Addendum:
CLINICAL DATA: Patient presents for focal tenderness within the
medial aspect of the right breast.

EXAM:
DIGITAL DIAGNOSTIC RIGHT MAMMOGRAM WITH CAD AND TOMO
ULTRASOUND RIGHT BREAST

[Series 1: us breast*right* limited inc axilla · 0.07mm/px · 2 of 2 slices shown]
[im 1/2]
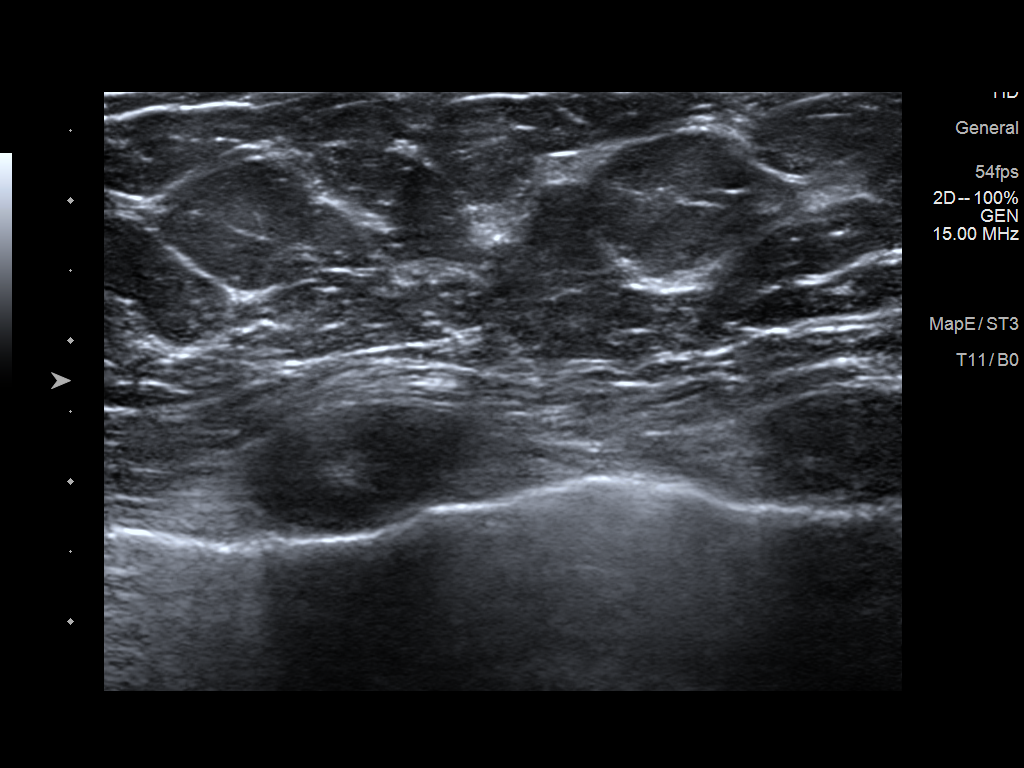
[im 2/2]
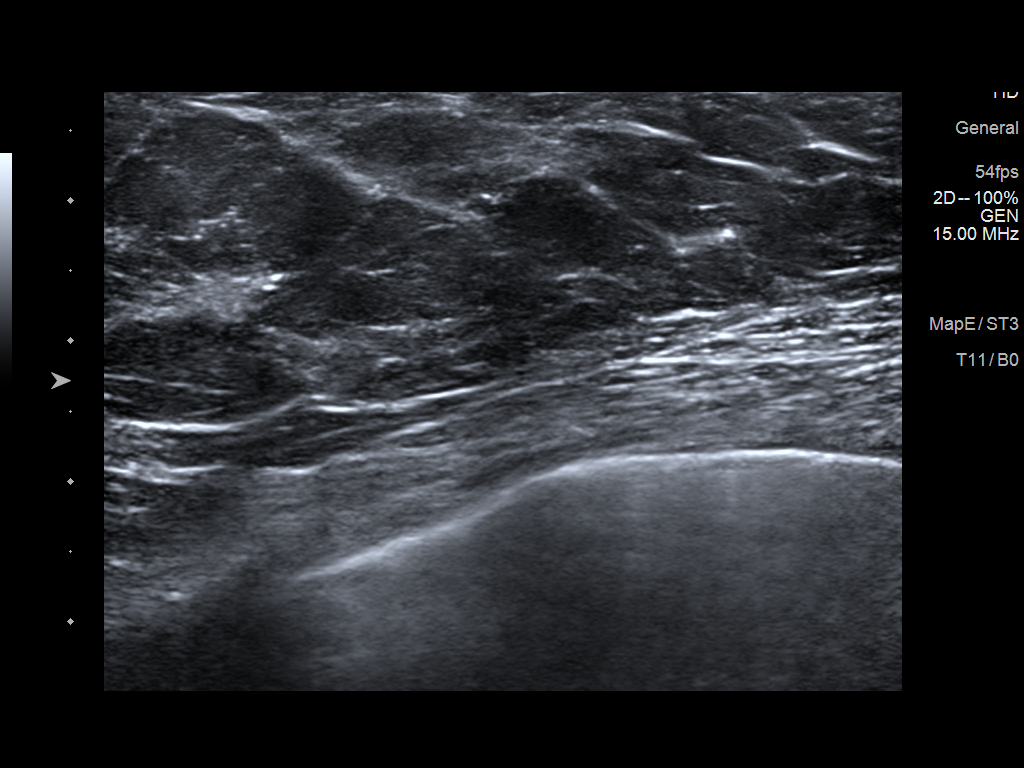

[2 of 2 positions shown; findings below may reference images not displayed]

ACR Breast Density Category c: The breast tissue is heterogeneously
dense, which may obscure small masses.
FINDINGS: No concerning masses, calcifications or distortion identified within
the right breast. No suspicious abnormality underlying the palpable
marker within the medial right breast.

Mammographic images were processed with CAD.

On physical exam, no discrete mass is palpated within the medial
right breast.

Targeted ultrasound is performed, showing normal tissue without
suspicious mass within the medial aspect of the right breast.
IMPRESSION: No mammographic evidence for malignancy.

No suspicious abnormality at the site of focal tenderness within the
medial right breast.

RECOMMENDATION:
Continued clinical evaluation for right breast tenderness.

Return to screening mammography [DATE]

I have discussed the findings and recommendations with the patient.
If applicable, a reminder letter will be sent to the patient
regarding the next appointment.

BI-RADS CATEGORY  1: Negative.

ADDENDUM:
Addendum for correction of recommendation.

Recommend bilateral diagnostic mammography and left breast
ultrasound in [DATE] for re-evaluation of probably benign left
breast masses.

*** End of Addendum ***
ACR Breast Density Category c: The breast tissue is heterogeneously
dense, which may obscure small masses.
FINDINGS: No concerning masses, calcifications or distortion identified within
the right breast. No suspicious abnormality underlying the palpable
marker within the medial right breast.

Mammographic images were processed with CAD.

On physical exam, no discrete mass is palpated within the medial
right breast.

Targeted ultrasound is performed, showing normal tissue without
suspicious mass within the medial aspect of the right breast.
IMPRESSION: No mammographic evidence for malignancy.

No suspicious abnormality at the site of focal tenderness within the
medial right breast.

RECOMMENDATION:
Continued clinical evaluation for right breast tenderness.

Return to screening mammography [DATE]

I have discussed the findings and recommendations with the patient.
If applicable, a reminder letter will be sent to the patient
regarding the next appointment.

BI-RADS CATEGORY  1: Negative.

## 2020-12-18 ENCOUNTER — Ambulatory Visit (INDEPENDENT_AMBULATORY_CARE_PROVIDER_SITE_OTHER): Payer: 59 | Admitting: Family Medicine

## 2020-12-18 ENCOUNTER — Ambulatory Visit: Payer: 59 | Admitting: Family Medicine

## 2020-12-18 ENCOUNTER — Other Ambulatory Visit: Payer: Self-pay

## 2020-12-18 ENCOUNTER — Encounter: Payer: Self-pay | Admitting: Family Medicine

## 2020-12-18 VITALS — BP 116/80 | HR 84 | Temp 98.7°F | Resp 18 | Ht 64.2 in | Wt 162.4 lb

## 2020-12-18 DIAGNOSIS — E663 Overweight: Secondary | ICD-10-CM

## 2020-12-18 DIAGNOSIS — E559 Vitamin D deficiency, unspecified: Secondary | ICD-10-CM

## 2020-12-18 DIAGNOSIS — Z0000 Encounter for general adult medical examination without abnormal findings: Secondary | ICD-10-CM

## 2020-12-18 LAB — BASIC METABOLIC PANEL
BUN: 12 mg/dL (ref 6–23)
CO2: 28 mEq/L (ref 19–32)
Calcium: 9.2 mg/dL (ref 8.4–10.5)
Chloride: 101 mEq/L (ref 96–112)
Creatinine, Ser: 0.69 mg/dL (ref 0.40–1.20)
GFR: 106.28 mL/min (ref >60.00)
Glucose, Bld: 81 mg/dL (ref 70–99)
Potassium: 4.1 mEq/L (ref 3.5–5.1)
Sodium: 137 mEq/L (ref 135–145)

## 2020-12-18 LAB — VITAMIN D 25 HYDROXY (VIT D DEFICIENCY, FRACTURES): VITD: 37.26 ng/mL (ref 30.00–100.00)

## 2020-12-18 LAB — LIPID PANEL
Cholesterol: 163 mg/dL (ref 0–200)
HDL: 56.5 mg/dL (ref >39.00)
LDL Cholesterol: 93 mg/dL (ref 0–99)
NonHDL: 106.2
Total CHOL/HDL Ratio: 3
Triglycerides: 66 mg/dL (ref 0.0–149.0)
VLDL: 13.2 mg/dL (ref 0.0–40.0)

## 2020-12-18 LAB — CBC WITH DIFFERENTIAL/PLATELET
Basophils Absolute: 0 10*3/uL (ref 0.0–0.1)
Basophils Relative: 1 % (ref 0.0–3.0)
Eosinophils Absolute: 0.1 10*3/uL (ref 0.0–0.7)
Eosinophils Relative: 3.1 % (ref 0.0–5.0)
HCT: 39.5 % (ref 36.0–46.0)
Hemoglobin: 13.5 g/dL (ref 12.0–15.0)
Lymphocytes Relative: 46.1 % — ABNORMAL HIGH (ref 12.0–46.0)
Lymphs Abs: 1.9 10*3/uL (ref 0.7–4.0)
MCHC: 34.1 g/dL (ref 30.0–36.0)
MCV: 90.9 fl (ref 78.0–100.0)
Monocytes Absolute: 0.4 10*3/uL (ref 0.1–1.0)
Monocytes Relative: 10.6 % (ref 3.0–12.0)
Neutro Abs: 1.6 10*3/uL (ref 1.4–7.7)
Neutrophils Relative %: 39.2 % — ABNORMAL LOW (ref 43.0–77.0)
Platelets: 227 10*3/uL (ref 150.0–400.0)
RBC: 4.34 Mil/uL (ref 3.87–5.11)
RDW: 11.6 % (ref 11.5–15.5)
WBC: 4.1 10*3/uL (ref 4.0–10.5)

## 2020-12-18 LAB — HEPATIC FUNCTION PANEL
ALT: 14 U/L (ref 0–35)
AST: 15 U/L (ref 0–37)
Albumin: 4.2 g/dL (ref 3.5–5.2)
Alkaline Phosphatase: 65 U/L (ref 39–117)
Bilirubin, Direct: 0.1 mg/dL (ref 0.0–0.3)
Total Bilirubin: 0.7 mg/dL (ref 0.2–1.2)
Total Protein: 6.6 g/dL (ref 6.0–8.3)

## 2020-12-18 LAB — TSH: TSH: 1.2 u[IU]/mL (ref 0.35–4.50)

## 2020-12-18 NOTE — Assessment & Plan Note (Signed)
Pt's PE WNL.  UTD on pap, mammo, colonoscopy, immunizations.  Check labs.  Anticipatory guidance provided.  

## 2020-12-18 NOTE — Assessment & Plan Note (Signed)
Check labs and replete prn. 

## 2020-12-18 NOTE — Patient Instructions (Signed)
Follow up in 1 year or as needed We'll notify you of your lab results and make any changes if needed Continue to work on healthy diet and regular exercise- you can do it! Call with any questions or concerns Stay Safe!  Stay Healthy! Have a great summer!

## 2020-12-18 NOTE — Progress Notes (Signed)
   Subjective:    Patient ID: Beverly Gray, female    DOB: 1976/06/06, 45 y.o.   MRN: [id number]  HPI CPE- UTD on colonoscopy, mammo, flu, Tdap, COVID, pap.  Reviewed past medical, surgical, family and social histories.   Patient Care Team    Relationship Specialty Notifications Start End  Midge Minium, MD PCP - General Family Medicine  11/09/15   Bobbye Charleston, MD Consulting Physician Obstetrics and Gynecology  11/09/15   Ladene Artist, MD Consulting Physician Gastroenterology  11/09/15     Health Maintenance  Topic Date Due   INFLUENZA VACCINE  01/28/2021   PAP SMEAR-Modifier  06/20/2023   COLONOSCOPY (Pts 45-47yrs Insurance coverage will need to be confirmed)  10/27/2024   TETANUS/TDAP  08/23/2027   COVID-19 Vaccine  Completed   Hepatitis C Screening  Completed   HIV Screening  Completed   Pneumococcal Vaccine 37-24 Years old  Aged Out   HPV VACCINES  Aged Out      Review of Systems Patient reports no vision/ hearing changes, adenopathy,fever, weight change,  persistant/recurrent hoarseness , swallowing issues, chest pain, palpitations, edema, persistant/recurrent cough, hemoptysis, dyspnea (rest/exertional/paroxysmal nocturnal), gastrointestinal bleeding (melena, rectal bleeding), abdominal pain, significant heartburn, bowel changes, GU symptoms (dysuria, hematuria, incontinence), Gyn symptoms (abnormal  bleeding, pain),  syncope, focal weakness, memory loss, numbness & tingling, skin/hair/nail changes, abnormal bruising or bleeding, anxiety, or depression.   This visit occurred during the SARS-CoV-2 public health emergency.  Safety protocols were in place, including screening questions prior to the visit, additional usage of staff PPE, and extensive cleaning of exam room while observing appropriate contact time as indicated for disinfecting solutions.      Objective:   Physical Exam General Appearance:    Alert, cooperative, no distress, appears stated age   Head:    Normocephalic, without obvious abnormality, atraumatic  Eyes:    PERRL, conjunctiva/corneas clear, EOM's intact, fundi    benign, both eyes  Ears:    Normal TM's and external ear canals, both ears  Nose:   Nares normal, septum midline, mucosa normal, no drainage    or sinus tenderness  Throat:   Lips, mucosa, and tongue normal; teeth and gums normal  Neck:   Supple, symmetrical, trachea midline, no adenopathy;    Thyroid: no enlargement/tenderness/nodules  Back:     Symmetric, no curvature, ROM normal, no CVA tenderness  Lungs:     Clear to auscultation bilaterally, respirations unlabored  Chest Wall:    No tenderness or deformity   Heart:    Regular rate and rhythm, S1 and S2 normal, no murmur, rub   or gallop  Breast Exam:    Deferred to GYN  Abdomen:     Soft, non-tender, bowel sounds active all four quadrants,    no masses, no organomegaly  Genitalia:    Deferred to GYN  Rectal:    Extremities:   Extremities normal, atraumatic, no cyanosis or edema  Pulses:   2+ and symmetric all extremities  Skin:   Skin color, texture, turgor normal, no rashes or lesions  Lymph nodes:   Cervical, supraclavicular, and axillary nodes normal  Neurologic:   CNII-XII intact, normal strength, sensation and reflexes    throughout          Assessment & Plan:

## 2020-12-21 ENCOUNTER — Other Ambulatory Visit: Payer: Self-pay | Admitting: Family Medicine

## 2020-12-21 NOTE — Telephone Encounter (Signed)
LFD 03/06/20 #90 with 2 refills LOV 12/18/20 NOV none

## 2020-12-26 ENCOUNTER — Encounter: Payer: Self-pay | Admitting: *Deleted

## 2020-12-30 ENCOUNTER — Encounter: Payer: Self-pay | Admitting: Family Medicine

## 2021-01-04 ENCOUNTER — Other Ambulatory Visit: Payer: Self-pay

## 2021-01-04 DIAGNOSIS — Z111 Encounter for screening for respiratory tuberculosis: Secondary | ICD-10-CM

## 2021-01-07 ENCOUNTER — Other Ambulatory Visit: Payer: Self-pay | Admitting: Family Medicine

## 2021-01-07 ENCOUNTER — Other Ambulatory Visit: Payer: 59

## 2021-01-07 ENCOUNTER — Other Ambulatory Visit: Payer: Self-pay

## 2021-01-07 DIAGNOSIS — Z111 Encounter for screening for respiratory tuberculosis: Secondary | ICD-10-CM

## 2021-01-08 NOTE — Telephone Encounter (Signed)
Last filled 03/29/20 #10 with 6 refills Last visit 09/17/20 Next visit none

## 2021-01-09 LAB — QUANTIFERON-TB GOLD PLUS
Mitogen-NIL: >10 IU/mL
NIL: 0.03 IU/mL
QuantiFERON-TB Gold Plus: NEGATIVE
TB1-NIL: 0 IU/mL
TB2-NIL: <0 IU/mL

## 2021-02-11 IMAGING — US US BREAST*R* LIMITED INC AXILLA
1 series · 11 of 11 positions shown · non-contrast
Comparison: Previous exam(s).

CLINICAL DATA: Recall from screening mammography with
tomosynthesis, possible asymmetry involving the OUTER RIGHT breast
at MIDDLE depth visible only on the CC images.

EXAM:
DIGITAL DIAGNOSTIC RIGHT MAMMOGRAM WITH CAD AND TOMO
ULTRASOUND RIGHT BREAST

[Series 1: us breast*right* limited inc axilla · 0.07mm/px · 11 of 11 slices shown]
[im 1/11]
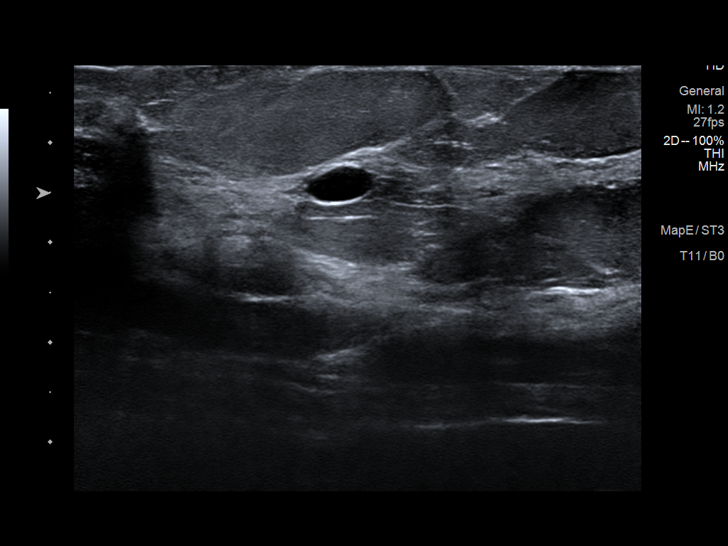
[im 2/11]
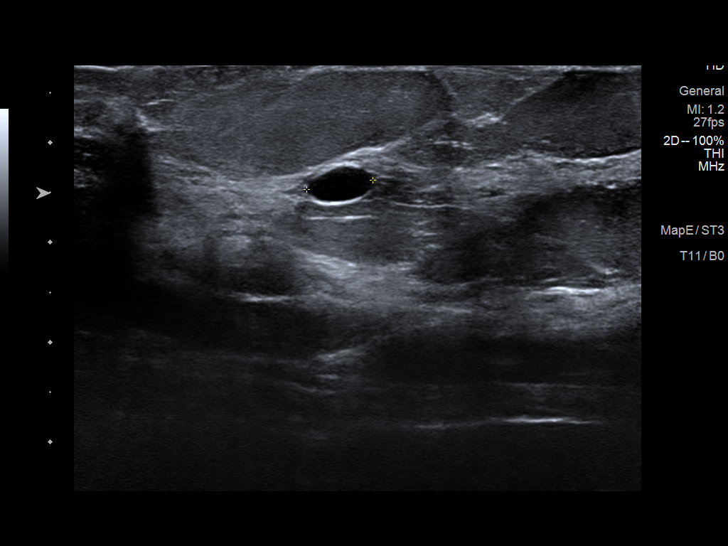
[im 3/11]
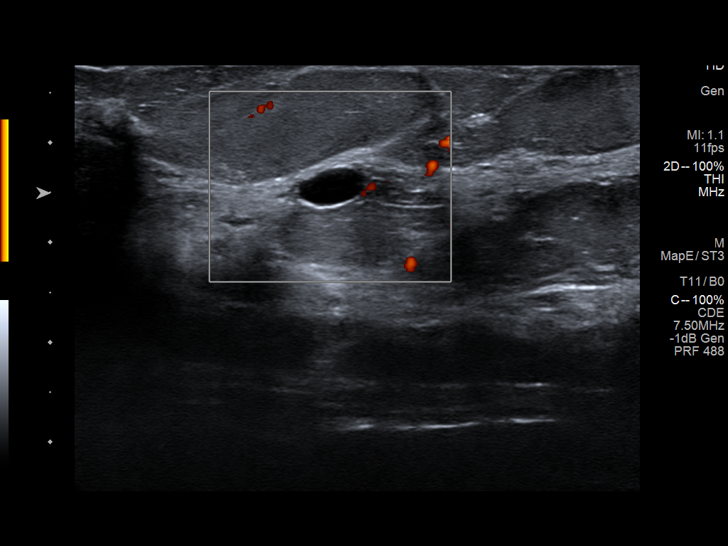
[im 4/11]
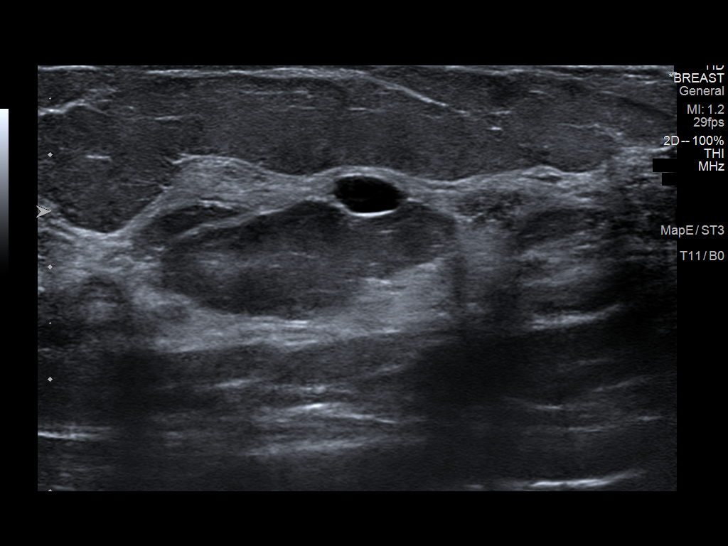
[im 5/11]
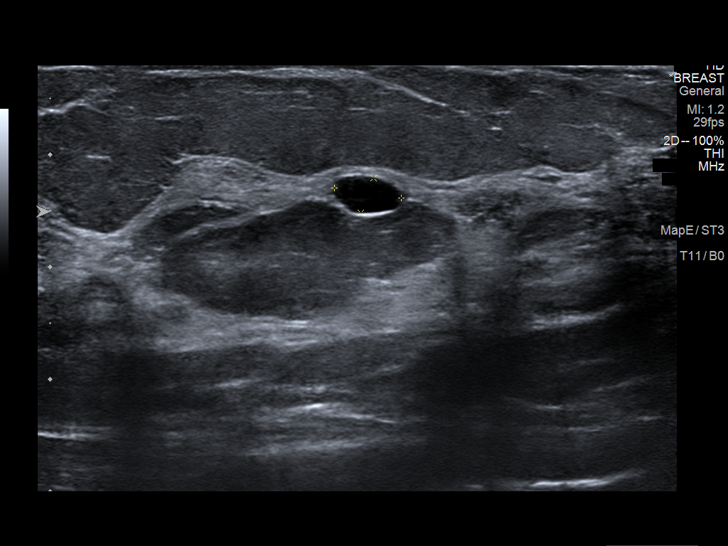
[im 6/11]
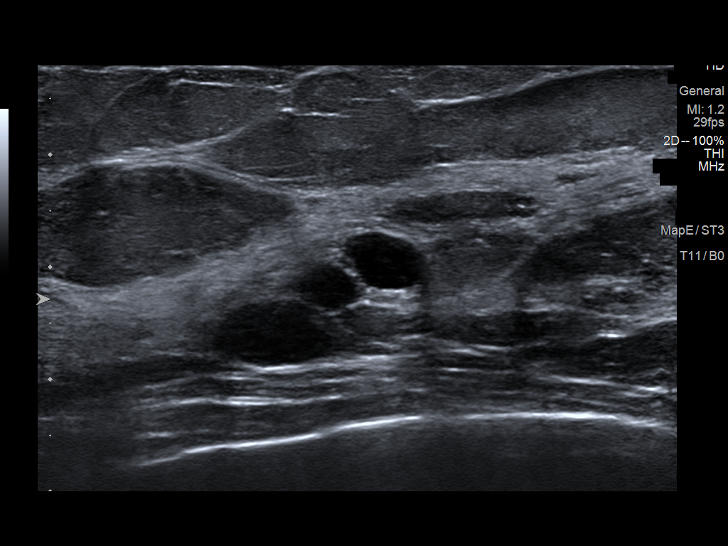
[im 7/11]
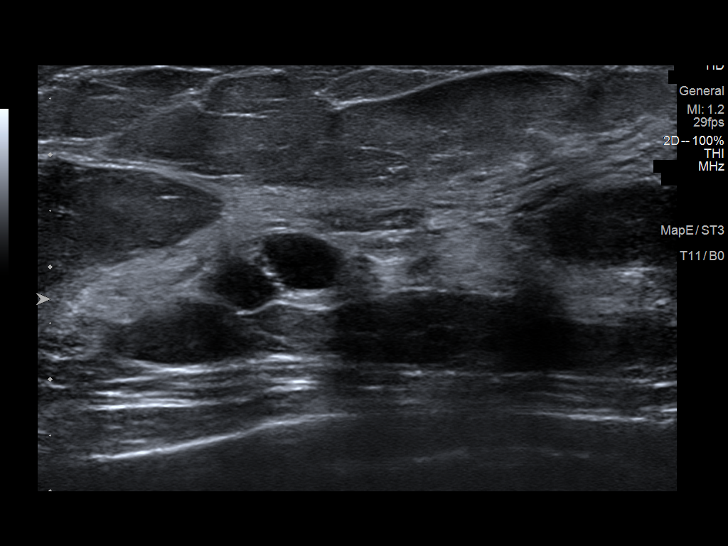
[im 8/11]
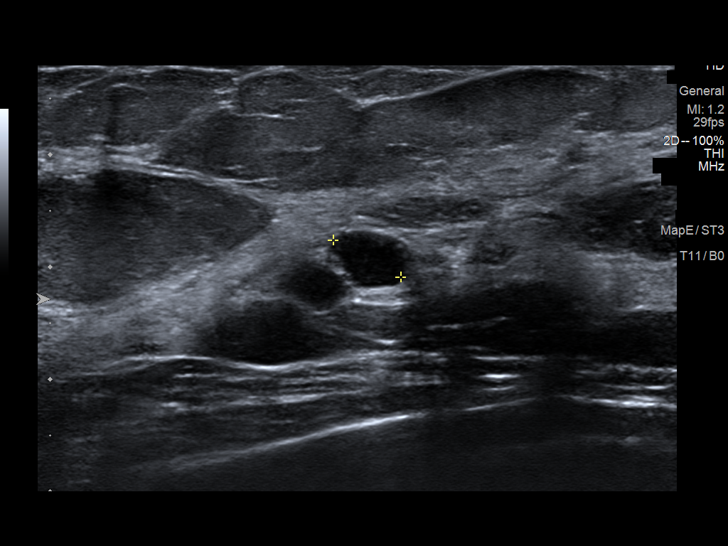
[im 9/11]
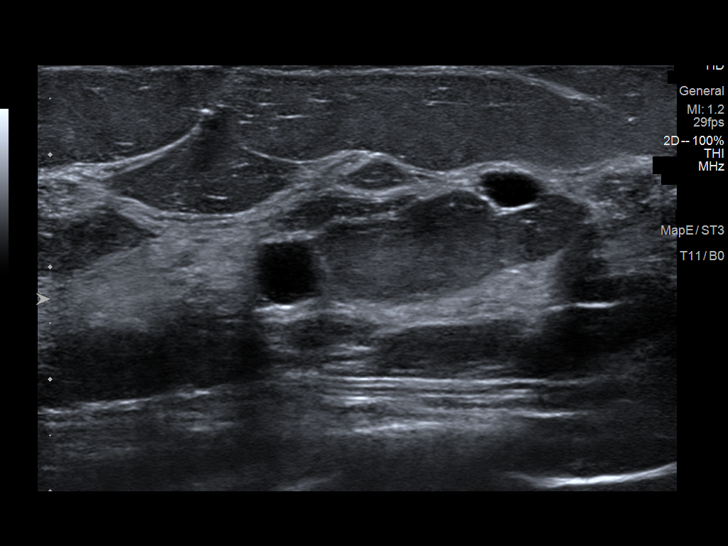
[im 10/11]
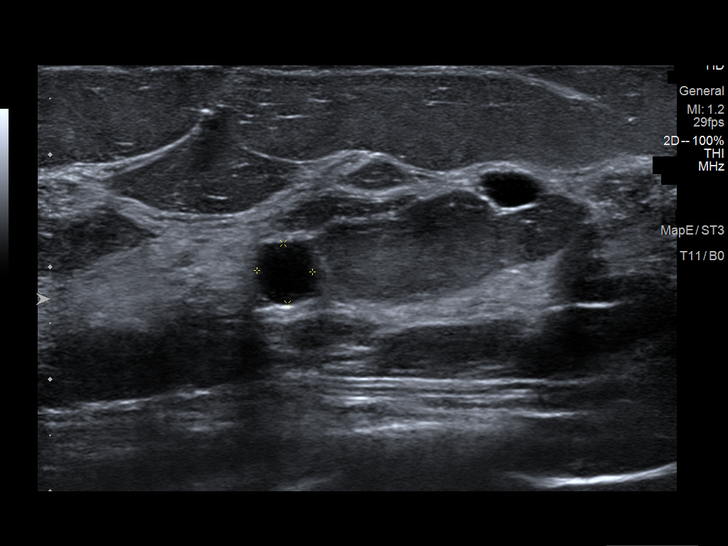
[im 11/11]
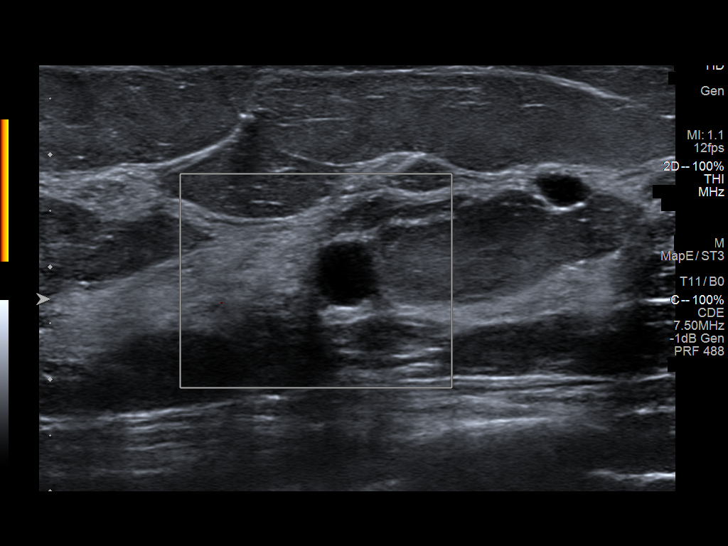

[11 of 11 positions shown; findings below may reference images not displayed]

ACR Breast Density Category b: There are scattered areas of
fibroglandular density.
FINDINGS: Tomosynthesis and synthesized spot compression CC view of the area
of concern in the RIGHT breast and a tomosynthesis and synthesized
full field mediolateral view of the RIGHT breast were obtained.

The asymmetry in the OUTER breast at MIDDLE depth questioned on
screening mammography partially disperses with compression,
indicating that it is compressible. The asymmetry localizes to the
retroareolar location on the full field mediolateral images,
indicating its location is likely at or near 9 o'clock.

No suspicious findings elsewhere in the RIGHT breast on the full
field mediolateral images.

The full field mediolateral image was processed with CAD.

Targeted RIGHT breast ultrasound is performed, showing adjacent
benign simple cysts in the OUTER breast. A simple cyst at the 9
o'clock position approximately 3 cm from the nipple at MIDDLE depth
measures approximately 3 x 6 x 7 mm, demonstrating posterior
acoustic enhancement and no internal power Doppler flow, likely
accounting for the screening mammographic finding. A simple cyst at
the 9:30 o'clock position approximately 3 cm from the nipple at
POSTERIOR depth measures approximately 5 x 5 x 7 mm, demonstrating
posterior acoustic enhancement and no internal power Doppler flow.
No suspicious solid mass or abnormal acoustic shadowing is
identified.
IMPRESSION: Benign cysts in the OUTER RIGHT breast, one of which accounts for
the screening mammographic finding.

RECOMMENDATION:
Screening mammogram in one year.(Code:6P-S-NM4)

I have discussed the findings and recommendations with the patient.
If applicable, a reminder letter will be sent to the patient
regarding the next appointment.

BI-RADS CATEGORY  2: Benign.

## 2021-02-11 IMAGING — MG MM DIGITAL DIAGNOSTIC UNILAT*R* W/ TOMO W/ CAD
4 series · 4 of 12 positions shown · non-contrast
Comparison: Previous exam(s).

CLINICAL DATA: Recall from screening mammography with
tomosynthesis, possible asymmetry involving the OUTER RIGHT breast
at MIDDLE depth visible only on the CC images.

EXAM:
DIGITAL DIAGNOSTIC RIGHT MAMMOGRAM WITH CAD AND TOMO
ULTRASOUND RIGHT BREAST

[R ML synth-2D]
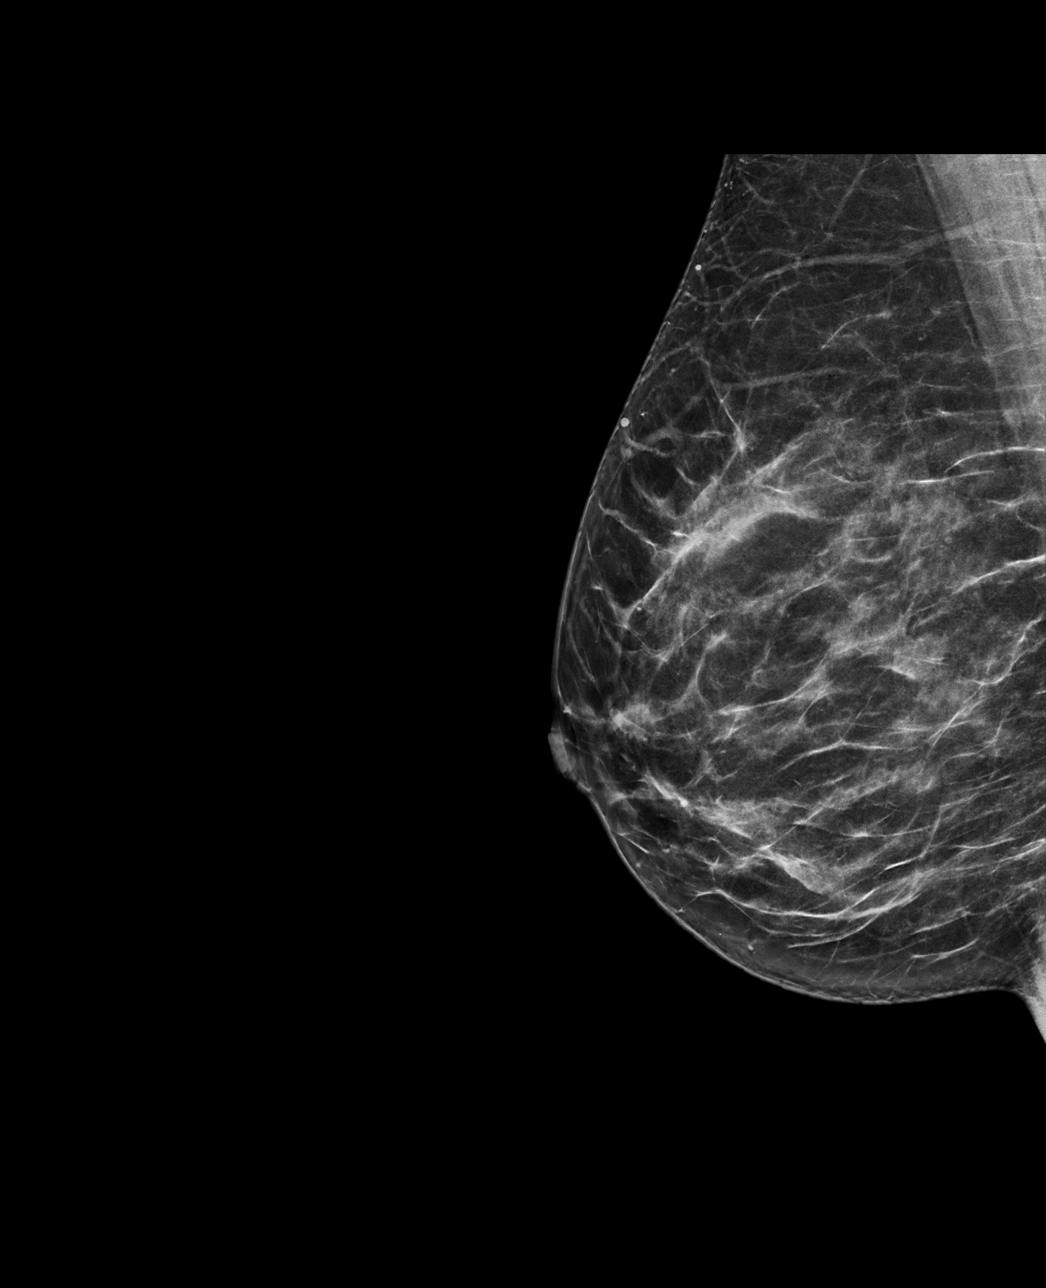

[R CC synth-2D]
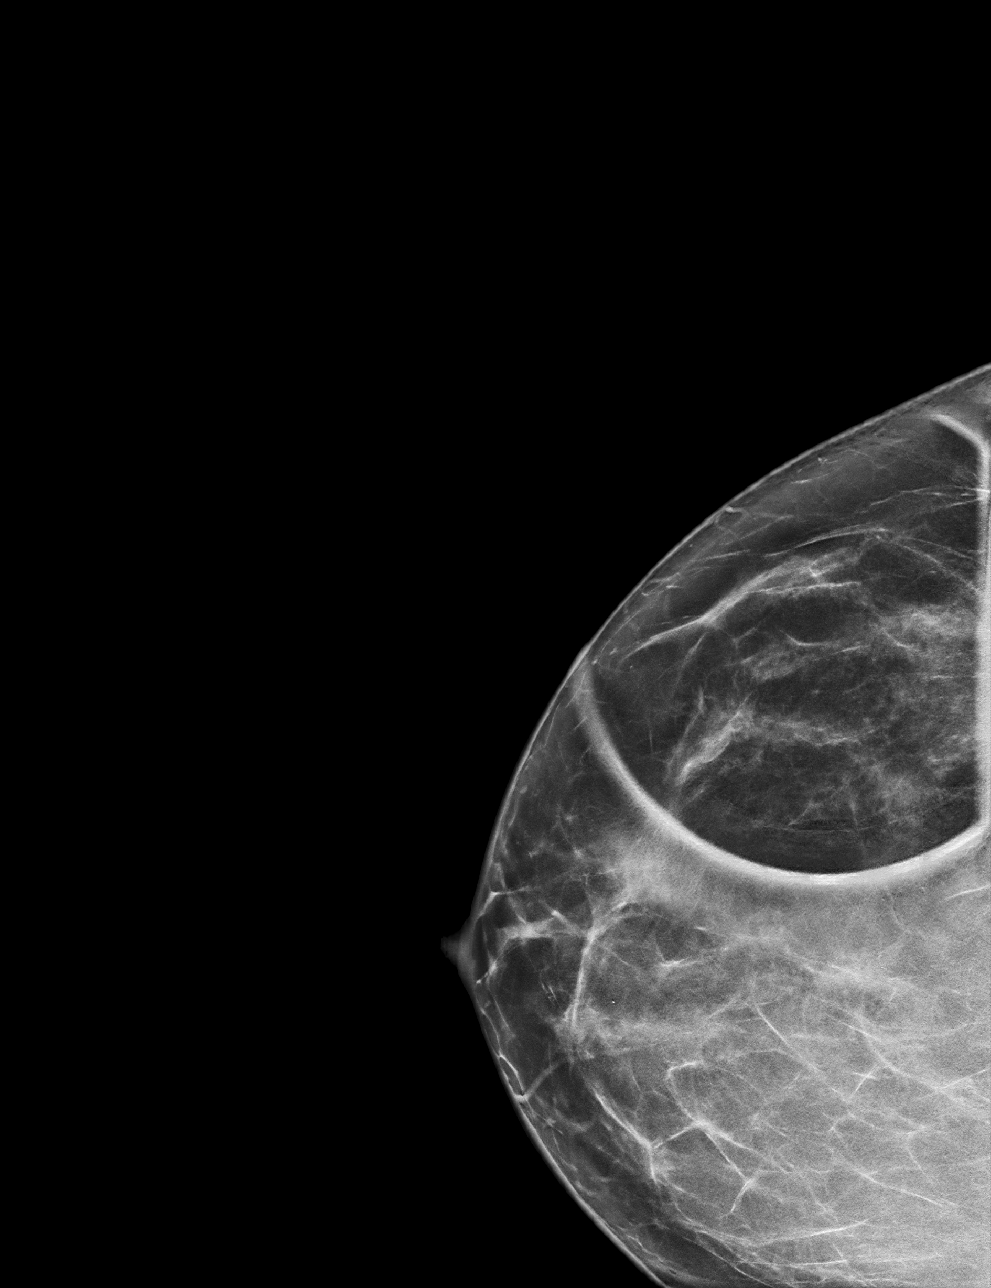

[R CC tomo · tomo slice 33/64.0]
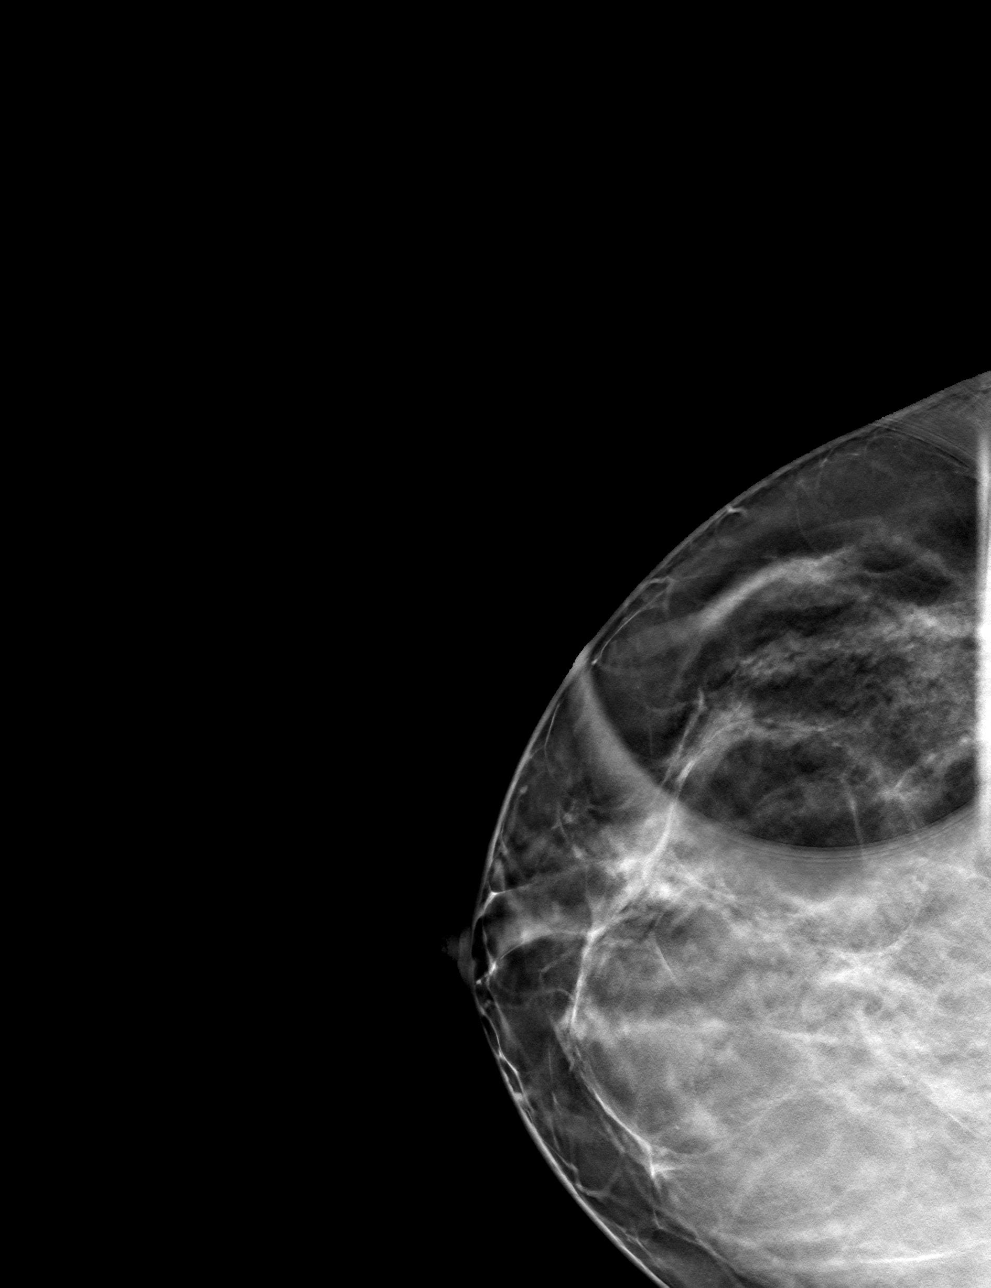

[R ML tomo · tomo slice 35/70.0]
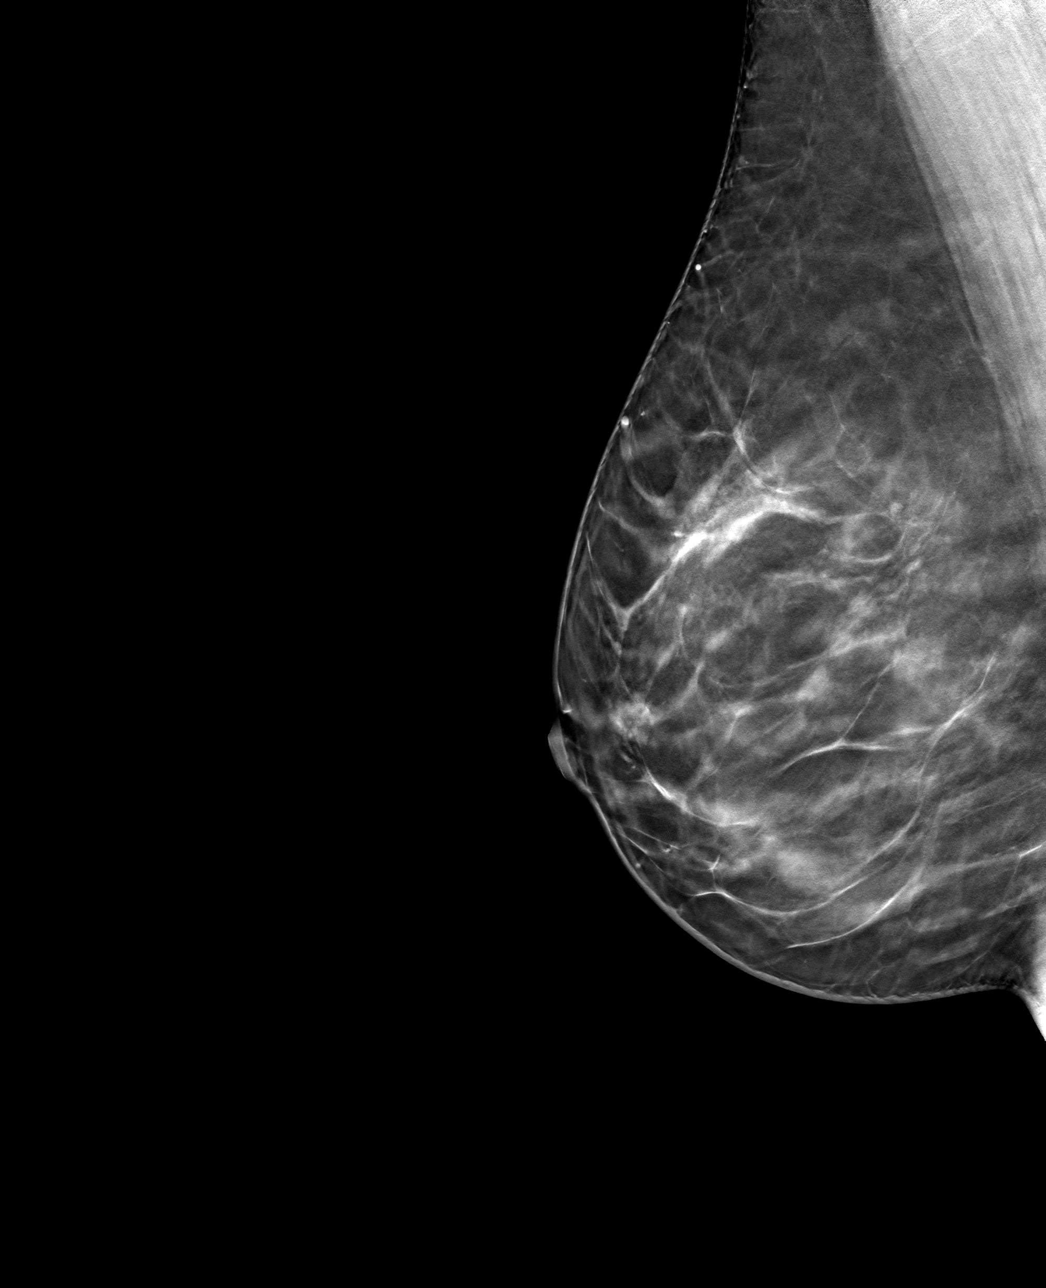

[4 of 12 positions shown; findings below may reference images not displayed]

ACR Breast Density Category b: There are scattered areas of
fibroglandular density.
FINDINGS: Tomosynthesis and synthesized spot compression CC view of the area
of concern in the RIGHT breast and a tomosynthesis and synthesized
full field mediolateral view of the RIGHT breast were obtained.

The asymmetry in the OUTER breast at MIDDLE depth questioned on
screening mammography partially disperses with compression,
indicating that it is compressible. The asymmetry localizes to the
retroareolar location on the full field mediolateral images,
indicating its location is likely at or near 9 o'clock.

No suspicious findings elsewhere in the RIGHT breast on the full
field mediolateral images.

The full field mediolateral image was processed with CAD.

Targeted RIGHT breast ultrasound is performed, showing adjacent
benign simple cysts in the OUTER breast. A simple cyst at the 9
o'clock position approximately 3 cm from the nipple at MIDDLE depth
measures approximately 3 x 6 x 7 mm, demonstrating posterior
acoustic enhancement and no internal power Doppler flow, likely
accounting for the screening mammographic finding. A simple cyst at
the 9:30 o'clock position approximately 3 cm from the nipple at
POSTERIOR depth measures approximately 5 x 5 x 7 mm, demonstrating
posterior acoustic enhancement and no internal power Doppler flow.
No suspicious solid mass or abnormal acoustic shadowing is
identified.
IMPRESSION: Benign cysts in the OUTER RIGHT breast, one of which accounts for
the screening mammographic finding.

RECOMMENDATION:
Screening mammogram in one year.(Code:6P-S-NM4)

I have discussed the findings and recommendations with the patient.
If applicable, a reminder letter will be sent to the patient
regarding the next appointment.

BI-RADS CATEGORY  2: Benign.

## 2021-02-19 ENCOUNTER — Telehealth: Payer: 59 | Admitting: Emergency Medicine

## 2021-02-19 DIAGNOSIS — R6889 Other general symptoms and signs: Secondary | ICD-10-CM | POA: Diagnosis not present

## 2021-02-19 MED ORDER — ONDANSETRON 4 MG PO TBDP: 4.0000 mg | ORAL_TABLET | Freq: Three times a day (TID) | ORAL | 0 refills | Status: DC | PRN

## 2021-02-19 MED ORDER — OSELTAMIVIR PHOSPHATE 75 MG PO CAPS: 75.0000 mg | ORAL_CAPSULE | Freq: Two times a day (BID) | ORAL | 0 refills | Status: DC

## 2021-02-19 MED ORDER — BENZONATATE 100 MG PO CAPS: 100.0000 mg | ORAL_CAPSULE | Freq: Two times a day (BID) | ORAL | 0 refills | Status: DC | PRN

## 2021-02-19 NOTE — Progress Notes (Signed)
Virtual Visit Consent   Beverly Gray, you are scheduled for a virtual visit with a [place] provider today.     Just as with appointments in the office, your consent must be obtained to participate.  Your consent will be active for this visit and any virtual visit you may have with one of our providers in the next 365 days.     If you have a MyChart account, a copy of this consent can be sent to you electronically.  All virtual visits are billed to your insurance company just like a traditional visit in the office.    As this is a virtual visit, video technology does not allow for your provider to perform a traditional examination.  This may limit your provider's ability to fully assess your condition.  If your provider identifies any concerns that need to be evaluated in person or the need to arrange testing (such as labs, EKG, etc.), we will make arrangements to do so.     Although advances in technology are sophisticated, we cannot ensure that it will always work on either your end or our end.  If the connection with a video visit is poor, the visit may have to be switched to a telephone visit.  With either a video or telephone visit, we are not always able to ensure that we have a secure connection.     I need to obtain your verbal consent now.   Are you willing to proceed with your visit today?    Beverly Gray has provided verbal consent on 02/19/2021 for a virtual visit video.   Beverly Circle, PA-C   Date: 02/19/2021 5:46 PM   Virtual Visit via Video Note   I, Beverly Gray, connected with  Beverly Gray  ([id number], 03/24/1976) on 02/19/21 at  5:45 PM EDT by a video-enabled telemedicine application and verified that I am speaking with the correct person using two identifiers.  Location: Patient: Virtual Visit Location Patient: Home Provider: Virtual Visit Location Provider: Home Office   I discussed the limitations of evaluation and management by  telemedicine and the availability of in person appointments. The patient expressed understanding and agreed to proceed.    History of Present Illness: Beverly Gray is a 45 y.o. who identifies as a female who was assigned female at birth, and is being seen today for chief complaint of flu like symptoms.  She states that daughter was diagnosed with flu A and pneumonia last week.  Patient started feeling badly on Sunday (3 days ago).  Reports associated cough.  Has tested negative for COVID.  Reports associated body aches and fatigue.  HPI: HPI  Problems:  Patient Active Problem List   Diagnosis Date Noted   ADHD 02/09/2020   Vitamin D deficiency 06/18/2018   Physical exam 06/11/2016   Migraine with aura and without status migrainosus 11/09/2015    Allergies:  Allergies  Allergen Reactions   Bee Venom Rash and Swelling   Medications:  Current Outpatient Medications:    atomoxetine (STRATTERA) 40 MG capsule, TAKE 1 CAPSULE BY MOUTH EVERY DAY, Disp: 90 capsule, Rfl: 2   BIOTIN PO, Take 1,000 mcg by mouth daily., Disp: , Rfl:    cetirizine (ZYRTEC) 10 MG tablet, Take 10 mg by mouth daily as needed. , Disp: , Rfl:    Magnesium 400 MG TABS, Take by mouth., Disp: , Rfl:    Multiple Vitamins-Minerals (MULTIVITAMIN ADULT PO), Take by mouth., Disp: , Rfl:  nitrofurantoin, macrocrystal-monohydrate, (MACROBID) 100 MG capsule, Take 1 capsule (100 mg total) by mouth 2 (two) times daily., Disp: 10 capsule, Rfl: 0   SUMAtriptan (IMITREX) 100 MG tablet, TAKE 1 TABLET BY MOUTH ONCE FOR 1 DOSE, MAY REPEAT IN 2 HOURS IF HEADACHE PERSISTS OR RECURS, Disp: 10 tablet, Rfl: 6  Observations/Objective: Patient is well-developed, well-nourished in no acute distress.  In her vehicle.  Head is normocephalic, atraumatic.  No labored breathing.  Speech is clear and coherent with logical content.  Patient is alert and oriented at baseline.    Assessment and Plan: 1. Flu-like symptoms - Trial  Tamiflu - Tessalon - Zofran   Follow Up Instructions: I discussed the assessment and treatment plan with the patient. The patient was provided an opportunity to ask questions and all were answered. The patient agreed with the plan and demonstrated an understanding of the instructions.  A copy of instructions were sent to the patient via MyChart.  The patient was advised to call back or seek an in-person evaluation if the symptoms worsen or if the condition fails to improve as anticipated.  Time:  I spent 12 minutes with the patient via telehealth technology discussing the above problems/concerns.    Beverly Circle, PA-C

## 2021-05-06 ENCOUNTER — Other Ambulatory Visit: Payer: Self-pay | Admitting: Obstetrics and Gynecology

## 2021-05-06 DIAGNOSIS — Z1501 Genetic susceptibility to malignant neoplasm of breast: Secondary | ICD-10-CM

## 2021-05-27 ENCOUNTER — Ambulatory Visit
Admission: RE | Admit: 2021-05-27 | Discharge: 2021-05-27 | Disposition: A | Discharge status: Home / Self Care | Payer: 59 | Source: Ambulatory Visit | Attending: Obstetrics and Gynecology | Admitting: Obstetrics and Gynecology

## 2021-05-27 ENCOUNTER — Other Ambulatory Visit: Payer: Self-pay

## 2021-05-27 DIAGNOSIS — Z1501 Genetic susceptibility to malignant neoplasm of breast: Secondary | ICD-10-CM

## 2021-05-27 MED ORDER — GADOBUTROL 1 MMOL/ML IV SOLN
7.0000 mL | Freq: Once | INTRAVENOUS | Status: AC | PRN
  Administered 2021-05-27: 7 mL via INTRAVENOUS

## 2021-09-22 ENCOUNTER — Other Ambulatory Visit: Payer: Self-pay | Admitting: Family Medicine

## 2021-09-23 ENCOUNTER — Other Ambulatory Visit: Payer: Self-pay | Admitting: Family Medicine

## 2021-10-03 ENCOUNTER — Encounter: Payer: Self-pay | Admitting: Family Medicine

## 2021-10-03 ENCOUNTER — Ambulatory Visit: Payer: 59 | Admitting: Family Medicine

## 2021-10-03 ENCOUNTER — Ambulatory Visit
Admission: RE | Admit: 2021-10-03 | Discharge: 2021-10-03 | Disposition: A | Discharge status: Home / Self Care | Payer: 59 | Source: Ambulatory Visit | Attending: Family Medicine | Admitting: Family Medicine

## 2021-10-03 VITALS — BP 110/64 | HR 81 | Temp 98.3°F | Resp 18 | Ht 64.2 in | Wt 162.8 lb

## 2021-10-03 DIAGNOSIS — M25561 Pain in right knee: Secondary | ICD-10-CM

## 2021-10-03 DIAGNOSIS — M79604 Pain in right leg: Secondary | ICD-10-CM

## 2021-10-03 NOTE — Patient Instructions (Addendum)
? ?  Ultrasound ordered to evaluate for Baker's cyst and less likely blood clot.  Ibuprofen temporarily as needed, compression knee brace may be helpful but stopped if that causes more discomfort.  Depending on ultrasound results we have the option of Ortho eval.  I will let you know once I see the results.  Thanks for coming in today. ? ?If you have lab work done today you will be contacted with your lab results within the next 2 weeks.  If you have not heard from [place] then please contact us. The fastest way to get your results is to register for My Chart. ? ? ?IF you received an x-ray today, you will receive an invoice from [facility]. Please contact [facility] at [phone] with questions or concerns regarding your invoice.  ? ?IF you received labwork today, you will receive an invoice from [place]. Please contact [facility] at [phone] with questions or concerns regarding your invoice.  ? ?Our billing staff will not be able to assist you with questions regarding bills from these companies. ? ?You will be contacted with the lab results as soon as they are available. The fastest way to get your results is to activate your My Chart account. Instructions are located on the last page of this paperwork. If you have not heard from [place] regarding the results in 2 weeks, please contact this office. ?  ? ? ?

## 2021-10-03 NOTE — Progress Notes (Signed)
? ?Subjective:  ?Patient ID: Beverly Gray, female    DOB: [birth year]  Age: 46 y.o. MRN: [id number] ? ?CC:  ?Chief Complaint  ?Patient presents with  ? Leg Pain  ?  Patient states she is having some pain behind her right knee for about a month after driving long distance. Patient states the pain is constant and getting more increased.  ? ? ?HPI ?Beverly Gray presents for  ? ?R leg pain: ?Pain past month. No known injury. Sore behind knee, no knee pain, clicking, popping. Sore to push on area. Overall mild.  ?Noticed after 8 hour drive. Ongoing ache behind  ?No hx of DVT or blood disorders.  ?No recent surgery.  ?No chest pain, dyspnea.   ?Tx: ibuprofen at night if worse. Min relief with stretching.  ? ? ?History ?Patient Active Problem List  ? Diagnosis Date Noted  ? ADHD 02/09/2020  ? Vitamin D deficiency 06/18/2018  ? Physical exam 06/11/2016  ? Migraine with aura and without status migrainosus 11/09/2015  ? ?Past Medical History:  ?Diagnosis Date  ? Abnormal Pap smear   ? History of GBS (group B streptococcus) UTI, currently pregnant   ? with first preg  ? Migraines   ? ?Past Surgical History:  ?Procedure Laterality Date  ? COLPOSCOPY    ? LAPAROSCOPIC APPENDECTOMY    ? uterine ablation  05/20/2016  ? WISDOM TOOTH EXTRACTION    ? ?Allergies  ?Allergen Reactions  ? Bee Venom Rash and Swelling  ? ?Prior to Admission medications   ?Medication Sig Start Date End Date Taking? Authorizing Provider  ?atomoxetine (STRATTERA) 40 MG capsule TAKE 1 CAPSULE BY MOUTH EVERY DAY 09/23/21  Yes Midge Minium, MD  ?benzonatate (TESSALON) 100 MG capsule Take 1 capsule (100 mg total) by mouth 2 (two) times daily as needed for cough. 02/19/21  Yes Montine Circle, PA-C  ?BIOTIN PO Take 1,000 mcg by mouth daily.   Yes [provider]  ?cetirizine (ZYRTEC) 10 MG tablet Take 10 mg by mouth daily as needed.    Yes [provider]  ?Magnesium 400 MG TABS Take by mouth.   Yes [provider]  ?Multiple Vitamins-Minerals (MULTIVITAMIN ADULT PO) Take by mouth.   Yes [provider]  ?nitrofurantoin, macrocrystal-monohydrate, (MACROBID) 100 MG capsule Take 1 capsule (100 mg total) by mouth 2 (two) times daily. 02/09/20  Yes Margarita Mail, PA-C  ?ondansetron (ZOFRAN ODT) 4 MG disintegrating tablet Take 1 tablet (4 mg total) by mouth every 8 (eight) hours as needed for nausea or vomiting. 02/19/21  Yes Montine Circle, PA-C  ?oseltamivir (TAMIFLU) 75 MG capsule Take 1 capsule (75 mg total) by mouth 2 (two) times daily. 02/19/21  Yes Montine Circle, PA-C  ?SUMAtriptan (IMITREX) 100 MG tablet TAKE 1 TABLET BY MOUTH ONCE FOR 1 DOSE, MAY REPEAT IN 2 HOURS IF HEADACHE PERSISTS OR RECURS 09/24/21  Yes Midge Minium, MD  ? ?Social History  ? ?Socioeconomic History  ? Marital status: Married  ?  Spouse name: Not on file  ? Number of children: 2  ? Years of education: Not on file  ? Highest education level: Not on file  ?Occupational History  ? Occupation: Therapist, sports  ?  Employer: [employer]  ?  Comment: works in [employer].  ?Tobacco Use  ? Smoking status: Never  ? Smokeless tobacco: Never  ?Substance and Sexual Activity  ? Alcohol use: Yes  ?  Comment: Occasionally  ? Drug use: No  ?  Sexual activity: Yes  ?Other Topics Concern  ? Not on file  ?Social History Narrative  ? 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 Married with 2 children, 1 son and 1 daughter  ? 3 caffeinated beverages daily never smoker no tobacco no drug use  ? ?Social Determinants of Health  ? ?Financial Resource Strain: Not on file  ?Food Insecurity: Not on file  ?Transportation Needs: Not on file  ?Physical Activity: Not on file  ?Stress: Not on file  ?Social Connections: Not on file  ?Intimate Partner Violence: Not on file  ? ? ?Review of Systems ?Per HPI.  ? ?Objective:  ? ?Vitals:  ? 10/03/21 0834  ?BP: 110/64  ?Pulse: 81  ?Resp: 18  ?Temp: 98.3 ?F (36.8 ?C)  ?TempSrc: Temporal  ?SpO2: 100%  ?Weight: 162  lb 12.8 oz (73.8 kg)  ?Height: 5' 4.2" (1.631 m)  ? ? ? ?Physical Exam ?Constitutional:   ?   General: She is not in acute distress. ?   Appearance: Normal appearance. She is well-developed.  ?HENT:  ?   Head: Normocephalic and atraumatic.  ?Cardiovascular:  ?   Rate and Rhythm: Normal rate.  ?Pulmonary:  ?   Effort: Pulmonary effort is normal.  ?Musculoskeletal:  ?   Comments: Left leg, pain-free range of motion including the knee, no edema, negative Homans. ?Right leg discomfort over popliteal fossa without cystic structure or mass.  Comfort towards the proximal calf but no cords.  Hamstring is nontender including with resisted knee flexion.  Knee without focal bony tenderness or effusion, skin intact edema or effusion.  Negative, varus, valgus, drawer, lachman, mcmurray.  Negative Homans  ?Neurological:  ?   Mental Status: She is alert and oriented to person, place, and time.  ?Psychiatric:     ?   Mood and Affect: Mood normal.  ? ? ?Assessment & Plan:  ?Beverly Gray is a 46 y.o. female . ?Right leg pain - Plan: US Venous Img Lower Unilateral Right, CANCELED: US Venous Img Lower Unilateral Right ? ?Posterior right knee pain - Plan: US Venous Img Lower Unilateral Right, CANCELED: US Venous Img Lower Unilateral Right ?Location possible Baker's cyst but with preceding long distance travel will check ultrasound to rule out DVT, less likely.  Knee exam overall reassuring otherwise.  Consider Ortho eval depending on ultrasound results.  Okay to continue intermittent Tylenol or ibuprofen, compressive knee brace if needed for now. ? ?No orders of the defined types were placed in this encounter. ? ?Patient Instructions  ? ? ?Ultrasound ordered to evaluate for Baker's cyst and less likely blood clot.  Ibuprofen temporarily as needed, compression knee brace may be helpful but stopped if that causes more discomfort.  Depending on ultrasound results we have the option of Ortho eval.  I will let you know once I see  the results.  Thanks for coming in today. ? ?If you have lab work done today you will be contacted with your lab results within the next 2 weeks.  If you have not heard from [place] then please contact us. The fastest way to get your results is to register for My Chart. ? ? ?IF you received an x-ray today, you will receive an invoice from [facility]. Please contact [facility] at [phone] with questions or concerns regarding your invoice.  ? ?IF you received labwork today, you will receive an invoice from [place]. Please contact [facility] at [phone] with questions or concerns  regarding your invoice.  ? ?Our billing staff will not be able to assist you with questions regarding bills from these companies. ? ?You will be contacted with the lab results as soon as they are available. The fastest way to get your results is to activate your My Chart account. Instructions are located on the last page of this paperwork. If you have not heard from [place] regarding the results in 2 weeks, please contact this office. ?  ? ? ? ? ? ?Signed,  ? ?Merri Ray, MD ?[place], [place] ?[facility] ?10/03/21 ?9:08 AM ? ? ?

## 2021-10-12 ENCOUNTER — Encounter: Payer: Self-pay | Admitting: Family Medicine

## 2021-12-11 ENCOUNTER — Ambulatory Visit (INDEPENDENT_AMBULATORY_CARE_PROVIDER_SITE_OTHER): Payer: 59 | Admitting: Family Medicine

## 2021-12-11 ENCOUNTER — Encounter: Payer: Self-pay | Admitting: Family Medicine

## 2021-12-11 VITALS — BP 110/78 | HR 123 | Temp 97.7°F | Resp 16 | Ht 64.2 in | Wt 167.2 lb

## 2021-12-11 DIAGNOSIS — Z117 Encounter for testing for latent tuberculosis infection: Secondary | ICD-10-CM | POA: Diagnosis not present

## 2021-12-11 NOTE — Progress Notes (Signed)
   Subjective:    Patient ID: Beverly Gray, female    DOB: 1976/01/19, 46 y.o.   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   Review of Systems For ROS see HPI     Objective:   Physical Exam Vitals reviewed.  Constitutional:      Appearance: Normal appearance.  HENT:     Head: Normocephalic and atraumatic.  Skin:    General: Skin is warm and dry.  Neurological:     General: No focal deficit present.     Mental Status: She is alert and oriented to person, place, and time.  Psychiatric:        Mood and Affect: Mood normal.        Behavior: Behavior normal.        Thought Content: Thought content normal.           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  Quantiferon ordered.

## 2021-12-11 NOTE — Patient Instructions (Signed)
Schedule your complete physical for August/Sept We'll notify you of your lab results and provide you with a copy Call with any questions or concerns Hang in there!!!

## 2021-12-17 ENCOUNTER — Telehealth: Payer: Self-pay

## 2021-12-17 LAB — QUANTIFERON-TB GOLD PLUS
Mitogen-NIL: >10 IU/mL
NIL: 0.07 IU/mL
QuantiFERON-TB Gold Plus: NEGATIVE
TB1-NIL: <0 IU/mL
TB2-NIL: <0 IU/mL

## 2021-12-17 NOTE — Telephone Encounter (Signed)
-----   Message from Midge Minium, MD sent at 12/17/2021  7:20 AM EDT ----- Negative for TB- great news!

## 2021-12-22 ENCOUNTER — Other Ambulatory Visit: Payer: Self-pay | Admitting: Family Medicine

## 2022-03-17 ENCOUNTER — Ambulatory Visit (INDEPENDENT_AMBULATORY_CARE_PROVIDER_SITE_OTHER): Payer: 59 | Admitting: Family Medicine

## 2022-03-17 ENCOUNTER — Encounter: Payer: Self-pay | Admitting: Family Medicine

## 2022-03-17 VITALS — BP 116/78 | HR 74 | Temp 97.6°F | Resp 16 | Ht 64.2 in | Wt 168.0 lb

## 2022-03-17 DIAGNOSIS — E559 Vitamin D deficiency, unspecified: Secondary | ICD-10-CM

## 2022-03-17 DIAGNOSIS — Z0000 Encounter for general adult medical examination without abnormal findings: Secondary | ICD-10-CM | POA: Diagnosis not present

## 2022-03-17 DIAGNOSIS — G43109 Migraine with aura, not intractable, without status migrainosus: Secondary | ICD-10-CM | POA: Diagnosis not present

## 2022-03-17 DIAGNOSIS — E663 Overweight: Secondary | ICD-10-CM

## 2022-03-17 LAB — CBC WITH DIFFERENTIAL/PLATELET
Basophils Absolute: 0 10*3/uL (ref 0.0–0.1)
Basophils Relative: 0.5 % (ref 0.0–3.0)
Eosinophils Absolute: 0.2 10*3/uL (ref 0.0–0.7)
Eosinophils Relative: 3.5 % (ref 0.0–5.0)
HCT: 38.9 % (ref 36.0–46.0)
Hemoglobin: 13.3 g/dL (ref 12.0–15.0)
Lymphocytes Relative: 45.5 % (ref 12.0–46.0)
Lymphs Abs: 2.6 10*3/uL (ref 0.7–4.0)
MCHC: 34.3 g/dL (ref 30.0–36.0)
MCV: 90.2 fl (ref 78.0–100.0)
Monocytes Absolute: 0.6 10*3/uL (ref 0.1–1.0)
Monocytes Relative: 9.8 % (ref 3.0–12.0)
Neutro Abs: 2.4 10*3/uL (ref 1.4–7.7)
Neutrophils Relative %: 40.7 % — ABNORMAL LOW (ref 43.0–77.0)
Platelets: 277 10*3/uL (ref 150.0–400.0)
RBC: 4.31 Mil/uL (ref 3.87–5.11)
RDW: 11.7 % (ref 11.5–15.5)
WBC: 5.8 10*3/uL (ref 4.0–10.5)

## 2022-03-17 LAB — BASIC METABOLIC PANEL
BUN: 8 mg/dL (ref 6–23)
CO2: 27 mEq/L (ref 19–32)
Calcium: 9.3 mg/dL (ref 8.4–10.5)
Chloride: 101 mEq/L (ref 96–112)
Creatinine, Ser: 0.71 mg/dL (ref 0.40–1.20)
GFR: 103.22 mL/min (ref >60.00)
Glucose, Bld: 76 mg/dL (ref 70–99)
Potassium: 3.7 mEq/L (ref 3.5–5.1)
Sodium: 137 mEq/L (ref 135–145)

## 2022-03-17 LAB — VITAMIN D 25 HYDROXY (VIT D DEFICIENCY, FRACTURES): VITD: 34.55 ng/mL (ref 30.00–100.00)

## 2022-03-17 LAB — LIPID PANEL
Cholesterol: 160 mg/dL (ref 0–200)
HDL: 52.8 mg/dL (ref >39.00)
LDL Cholesterol: 84 mg/dL (ref 0–99)
NonHDL: 107.61
Total CHOL/HDL Ratio: 3
Triglycerides: 116 mg/dL (ref 0.0–149.0)
VLDL: 23.2 mg/dL (ref 0.0–40.0)

## 2022-03-17 LAB — HEPATIC FUNCTION PANEL
ALT: 18 U/L (ref 0–35)
AST: 18 U/L (ref 0–37)
Albumin: 4.1 g/dL (ref 3.5–5.2)
Alkaline Phosphatase: 72 U/L (ref 39–117)
Bilirubin, Direct: 0.1 mg/dL (ref 0.0–0.3)
Total Bilirubin: 0.4 mg/dL (ref 0.2–1.2)
Total Protein: 7.3 g/dL (ref 6.0–8.3)

## 2022-03-17 LAB — TSH: TSH: 1.56 u[IU]/mL (ref 0.35–5.50)

## 2022-03-17 NOTE — Patient Instructions (Addendum)
Follow up in 1 year or as needed We'll notify you of your lab results and make any changes if needed We'll call you w/ your neuro appt for the migraines Keep up the good work on healthy diet and regular exercise- you look great!!! Call with any questions or concerns Stay Safe!  Stay Healthy! Happy Fall!!!

## 2022-03-17 NOTE — Assessment & Plan Note (Signed)
Deteriorated.  Pt now having 3-5 days of migraines clustered together and then 1-2 weeks symptom free.  Taking Imitrex regularly w/ only partial and temporary relief.  Was not able to tolerate propranolol due to low BP.  Not interested in Topamax due to cognitive side effects.  Will refer to neuro for complete evaluation and tx.  Pt expressed understanding and is in agreement w/ plan.

## 2022-03-17 NOTE — Progress Notes (Signed)
   Subjective:    Patient ID: Beverly Gray, female    DOB: 1976-06-24, 46 y.o.   MRN: [id number]  HPI CPE- UTD on pap, colonoscopy, Tdap, mammo  Patient Care Team    Relationship Specialty Notifications Start End  Midge Minium, MD PCP - General Family Medicine  12/11/21   Bobbye Charleston, MD Consulting Physician Obstetrics and Gynecology  11/09/15   Ladene Artist, MD Consulting Physician Gastroenterology  11/09/15     Health Maintenance  Topic Date Due   PAP SMEAR-Modifier  06/20/2023   COLONOSCOPY (Pts 45-74yr Insurance coverage will need to be confirmed)  10/27/2024   TETANUS/TDAP  08/23/2027   INFLUENZA VACCINE  Completed   COVID-19 Vaccine  Completed   Hepatitis C Screening  Completed   HIV Screening  Completed   HPV VACCINES  Aged Out     Review of Systems Patient reports no vision/ hearing changes, adenopathy,fever, weight change,  persistant/recurrent hoarseness , swallowing issues, chest pain, palpitations, edema, persistant/recurrent cough, hemoptysis, dyspnea (rest/exertional/paroxysmal nocturnal), gastrointestinal bleeding (melena, rectal bleeding), abdominal pain, significant heartburn, bowel changes, GU symptoms (dysuria, hematuria, incontinence), Gyn symptoms (abnormal  bleeding, pain),  syncope, focal weakness, memory loss, numbness & tingling, skin/hair/nail changes, abnormal bruising or bleeding, anxiety, or depression.   + migraines- pt reports she will have 3-5 days of migraines back to back, then ease off for 1-2 weeks.  Taking Imitrex fairly regularly.  Magnesium was initially helpful but not any more.  Propranolol caused low BP.  Not willing to do Topamax due to side effects (currently in school)    Objective:   Physical Exam General Appearance:    Alert, cooperative, no distress, appears stated age  Head:    Normocephalic, without obvious abnormality, atraumatic  Eyes:    PERRL, conjunctiva/corneas clear, EOM's intact both eyes  Ears:     Normal TM's and external ear canals, both ears  Nose:   Nares normal, septum midline, mucosa normal, no drainage    or sinus tenderness  Throat:   Lips, mucosa, and tongue normal; teeth and gums normal  Neck:   Supple, symmetrical, trachea midline, no adenopathy;    Thyroid: no enlargement/tenderness/nodules  Back:     Symmetric, no curvature, ROM normal, no CVA tenderness  Lungs:     Clear to auscultation bilaterally, respirations unlabored  Chest Wall:    No tenderness or deformity   Heart:    Regular rate and rhythm, S1 and S2 normal, no murmur, rub   or gallop  Breast Exam:    Deferred to GYN  Abdomen:     Soft, non-tender, bowel sounds active all four quadrants,    no masses, no organomegaly  Genitalia:    Deferred to GYN  Rectal:    Extremities:   Extremities normal, atraumatic, no cyanosis or edema  Pulses:   2+ and symmetric all extremities  Skin:   Skin color, texture, turgor normal, no rashes or lesions  Lymph nodes:   Cervical, supraclavicular, and axillary nodes normal  Neurologic:   CNII-XII intact, normal strength, sensation and reflexes    throughout          Assessment & Plan:

## 2022-03-17 NOTE — Assessment & Plan Note (Signed)
Pt's PE WNL.  UTD on pap, mammo, colonoscopy, Tdap, flu.  Check labs.  Anticipatory guidance provided.  

## 2022-03-17 NOTE — Assessment & Plan Note (Signed)
Check labs and replete prn. 

## 2022-03-20 ENCOUNTER — Other Ambulatory Visit: Payer: Self-pay | Admitting: Family Medicine

## 2022-03-20 NOTE — Telephone Encounter (Signed)
Patient is requesting a refill of the following medications: Requested Prescriptions   Pending Prescriptions Disp Refills   atomoxetine (STRATTERA) 40 MG capsule [Pharmacy Med Name: ATOMOXETINE HCL 40 MG CAPSULE] 90 capsule 0    Sig: TAKE 1 CAPSULE BY MOUTH EVERY DAY    Date of patient request: 03/20/22 Last office visit: 03/17/22 Date of last refill: 12/23/21 Last refill amount: 90 Follow up time period per chart: 1 year

## 2022-05-04 ENCOUNTER — Other Ambulatory Visit: Payer: Self-pay | Admitting: Family Medicine

## 2022-05-07 ENCOUNTER — Ambulatory Visit: Payer: 59 | Admitting: Psychiatry

## 2022-05-07 VITALS — BP 117/77 | HR 76 | Ht 64.0 in | Wt 166.4 lb

## 2022-05-07 DIAGNOSIS — G43109 Migraine with aura, not intractable, without status migrainosus: Secondary | ICD-10-CM

## 2022-05-07 MED ORDER — PROTRIPTYLINE HCL 10 MG PO TABS: 10.0000 mg | ORAL_TABLET | Freq: Two times a day (BID) | ORAL | 6 refills | Status: DC

## 2022-05-07 NOTE — Patient Instructions (Signed)
Natural supplements that can reduce migraines: Magnesium Oxide or Magnesium Glycinate 500 mg at bed (up to 800 mg daily) Coenzyme Q10 300 mg in AM Vitamin B2- 200 mg twice a day  Add 1 supplement at a time since even natural supplements can have undesirable side effects. You can sometimes buy supplements cheaper (especially Coenzyme Q10) at www.[email or web address]://compton-perez.com/ or at [employer].  Magnesium: Magnesium (250 mg twice a day or 500 mg at bed) has a relaxant effect on smooth muscles such as blood vessels. Individuals suffering from frequent or daily headache usually have low magnesium levels which can be increase with daily supplementation of 400-800 mg. Three trials found 40-90% average headache reduction  when used as a preventative. Magnesium also demonstrated the benefit in menstrually related migraine.  Magnesium is part of the messenger system in the serotonin cascade and it is a good muscle relaxant.  It is also useful for constipation. Good sources include nuts, whole grains, and tomatoes. Side Effects: loose stool/diarrhea  Riboflavin (vitamin B 2) 200 mg twice a day. This vitamin assists nerve cells in the production of ATP a principal energy storing molecule.  It is necessary for many chemical reactions in the body.  There have been at least 3 clinical trials of riboflavin using 400 mg per day all of which suggested that migraine frequency can be decreased.  All 3 trials showed significant improvement in over half of migraine sufferers.  The supplement is found in bread, cereal, milk, meat, and poultry.  Most Americans get more riboflavin than the recommended daily allowance, however riboflavin deficiency is not necessary for the supplements to help prevent headache. Side effects: energizing, green urine  Coenzyme Q10: This is present in almost all cells in the body and is critical component for the conversion of energy.  Recent studies have shown that a nutritional supplement of CoQ10 can reduce the  frequency of migraine attacks by improving the energy production of cells as with riboflavin.  Doses of 150 mg twice a day have been shown to be effective.

## 2022-05-07 NOTE — Progress Notes (Signed)
Referring:  Midge Minium, MD [address] [place],  [place] [address]  PCP: Midge Minium, MD  Neurology was asked to evaluate Beverly Gray, a 46 year old female for a chief complaint of headaches.  Our recommendations of care will be communicated by shared medical record.    CC:  headaches  History provided from self  HPI:  Medical co-morbidities: anxiety  The patient presents for evaluation of headaches which began when she was 46 years old. She currently averages 1-3 migraine days per week. They have increased in frequency since she started grad school. Her migraines are associated with photophobia, phonophobia, and nausea. She will also occasionally have a visual aura and has had 3 migraines which were associated with aphasia.  She takes Imitrex 100 mg PRN which does help relieve her headache, but makes her feel "disconnected".  Headache History: Onset: age 47 Triggers: stress, lack of sleep, dehydration, computer screens Aura: spots in her vision, had 3 episodes with aphasia Associated Symptoms:  Photophobia: yes  Phonophobia: yes  Nausea: yes Allodynia: yes Other symptoms: brain fog Worse with activity?: yes Duration of headaches: 1-2 hours with medication  Migraine days per month: 12 Headache free days per month: 18  Current Treatment: Abortive Imitrex 100 mg PRN  Preventative none  Prior Therapies                                 Fioricet Imitrex 100 mg PRN  Relpax 40 mg PRN Propranolol 20 mg BID - hypotension   LABS: CBC    Component Value Date/Time   WBC 5.8 03/17/2022 1403   RBC 4.31 03/17/2022 1403   HGB 13.3 03/17/2022 1403   HCT 38.9 03/17/2022 1403   PLT 277.0 03/17/2022 1403   MCV 90.2 03/17/2022 1403   MCH 30.4 08/27/2011 0540   MCHC 34.3 03/17/2022 1403   RDW 11.7 03/17/2022 1403   LYMPHSABS 2.6 03/17/2022 1403   MONOABS 0.6 03/17/2022 1403   EOSABS 0.2 03/17/2022 1403   BASOSABS 0.0 03/17/2022 1403       Latest Ref Rng & Units 03/17/2022    2:03 PM 12/18/2020    8:12 AM 08/26/2019   12:00 PM  CMP  Glucose 70 - 99 mg/dL 76  81  88   BUN 6 - 23 mg/dL '8  12  11   '$ Creatinine 0.40 - 1.20 mg/dL 0.71  0.69  0.72   Sodium 135 - 145 mEq/L 137  137  137   Potassium 3.5 - 5.1 mEq/L 3.7  4.1  4.1   Chloride 96 - 112 mEq/L 101  101  102   CO2 19 - 32 mEq/L '27  28  27   '$ Calcium 8.4 - 10.5 mg/dL 9.3  9.2  9.5   Total Protein 6.0 - 8.3 g/dL 7.3  6.6  7.6   Total Bilirubin 0.2 - 1.2 mg/dL 0.4  0.7  0.6   Alkaline Phos 39 - 117 U/L 72  65  68   AST 0 - 37 U/L '18  15  19   '$ ALT 0 - 35 U/L '18  14  20      '$ IMAGING:  none  Current Outpatient Medications on File Prior to Visit  Medication Sig Dispense Refill   atomoxetine (STRATTERA) 40 MG capsule TAKE 1 CAPSULE BY MOUTH EVERY DAY 90 capsule 0   cholecalciferol (VITAMIN D3) 25 MCG (1000 UNIT)  tablet Take 1,000 Units by mouth daily.     Multiple Vitamins-Minerals (MULTIVITAMIN ADULT PO) Take by mouth.     SUMAtriptan (IMITREX) 100 MG tablet TAKE 1 TABLET BY MOUTH ONCE FOR 1 DOSE, MAY REPEAT IN 2 HOURS IF HEADACHE PERSISTS OR RECURS 10 tablet 6   Magnesium 400 MG TABS Take by mouth. (Patient not taking: Reported on 05/07/2022)     No current facility-administered medications on file prior to visit.     Allergies: Allergies  Allergen Reactions   Bee Venom Rash and Swelling    Family History: Migraine or other headaches in the family:  mother, father, both sisters Aneurysms in a first degree relative:  none Brain tumors in the family:  none Other neurological illness in the family:   none  Past Medical History: Past Medical History:  Diagnosis Date   Abnormal Pap smear    History of GBS (group B streptococcus) UTI, currently pregnant    with first preg   Migraines     Past Surgical History Past Surgical History:  Procedure Laterality Date   COLPOSCOPY     LAPAROSCOPIC APPENDECTOMY     uterine ablation  05/20/2016   WISDOM TOOTH  EXTRACTION      Social History: Social History   Tobacco Use   Smoking status: Never   Smokeless tobacco: Never  Substance Use Topics   Alcohol use: Yes    Comment: Occasionally   Drug use: No    ROS: Negative for fevers, chills. Positive for headaches. All other systems reviewed and negative unless stated otherwise in HPI.   Physical Exam:   Vital Signs: BP 117/77   Pulse 76   Ht '5\' 4"'$  (1.626 m)   Wt 166 lb 6 oz (75.5 kg)   BMI 28.56 kg/m  GENERAL: well appearing,in no acute distress,alert SKIN:  Color, texture, turgor normal. No rashes or lesions HEAD:  Normocephalic/atraumatic. CV:  RRR RESP: Normal respiratory effort MSK: no tenderness to palpation over occiput, neck, or shoulders  NEUROLOGICAL: Mental Status: Alert, oriented to person, place and time,Follows commands Cranial Nerves: PERRL, visual fields intact to confrontation, extraocular movements intact, facial sensation intact, no facial droop or ptosis, hearing grossly intact, no dysarthria Motor: muscle strength 5/5 both upper and lower extremities Reflexes: 2+ throughout Sensation: intact to light touch all 4 extremities Coordination: Finger-to- nose-finger intact bilaterally Gait: normal-based   IMPRESSION: 46 year old female with a history of anxiety who presents for evaluation of migraines. Neurological exam today is normal. She is currently averaging up to 12 migraines per month and would like to start a preventive medication. She is hesitant to start any medications that could cause sedation or cognitive side effects as she has children and is in grad school. Will start protriptyline for migraine prevention. She is happy with her current rescue regimen, will continue PRN Imitrex.  PLAN: -Prevention: Start protriptyline 10 mg BID -Rescue: Continue Imitrex 100 mg PRN -Next steps: consider zonisamide, CGRP, Qulipta   I spent a total of 28 minutes chart reviewing and counseling the patient. Headache  education was done. Discussed treatment options including preventive and acute medications, and natural supplements. Discussed medication side effects, adverse reactions and drug interactions. Written educational materials and patient instructions outlining all of the above were given.  Follow-up: 3 months   Genia Harold, MD 05/07/2022   9:57 AM

## 2022-06-02 ENCOUNTER — Encounter (INDEPENDENT_AMBULATORY_CARE_PROVIDER_SITE_OTHER): Payer: 59 | Admitting: Psychiatry

## 2022-06-02 DIAGNOSIS — G43119 Migraine with aura, intractable, without status migrainosus: Secondary | ICD-10-CM | POA: Diagnosis not present

## 2022-06-02 NOTE — Telephone Encounter (Signed)
Please advise upon your return, what other preventative can she try?

## 2022-06-03 MED ORDER — QULIPTA 60 MG PO TABS: 60.0000 mg | ORAL_TABLET | Freq: Every day | ORAL | 6 refills | Status: DC

## 2022-06-03 NOTE — Telephone Encounter (Signed)

## 2022-06-04 ENCOUNTER — Encounter: Payer: Self-pay | Admitting: Neurology

## 2022-06-17 ENCOUNTER — Telehealth: Payer: Self-pay | Admitting: Neurology

## 2022-06-17 NOTE — Telephone Encounter (Signed)
Attempted a PA with the new insurance that was provided by the patient and it is still not locating. Will have to call the insurance to complete PA

## 2022-06-18 NOTE — Telephone Encounter (Signed)
Called the [facility] number and had to complete the prio auth over the phone for the pt. Answered the questions over the phone PA # [id number]. Will await determination

## 2022-06-18 NOTE — Telephone Encounter (Signed)
Approval received via fax 06/18/22-09/17/22. Pt notified.

## 2022-08-26 ENCOUNTER — Other Ambulatory Visit: Payer: Self-pay | Admitting: Psychiatry

## 2022-08-27 NOTE — Progress Notes (Unsigned)
Patient: Beverly Gray Date of Birth: 1976/02/09  Reason for Visit: Follow up History from: Patient Primary Neurologist: [facility]  ASSESSMENT AND PLAN 47 y.o. year old female   18.  Chronic migraine headache -Continue Qulipta 60 mg daily for migraine preventative, we will work on [employer]  -Continue Imitrex as needed for acute headache, for milder headaches will use ibuprofen -Follow-up in 1 year or sooner if needed  HISTORY OF PRESENT ILLNESS: Today 08/28/22 She tried protriptyline but had dry mouth and blurry vision.  She started Qulipta 60 mg daily around December 2023.  Has had excellent benefit.  Unfortunately she has had some interruption in consistent administration due to insurance issues, stock at the pharmacy.  When on Lenoria Chime has not had any significant migraine headaches.  The headaches she has had have been very mild and has been able to take ibuprofen with good benefit.  She is pleased with her rescue medication Imitrex.  Denies any side effects of Qulipta.  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  HISTORY 05/07/22 Dr. Billey Gosling HPI:  Medical co-morbidities: anxiety   The patient presents for evaluation of headaches which began when she was 47 years old. She currently averages 1-3 migraine days per week. They have increased in frequency since she started grad school. Her migraines are associated with photophobia, phonophobia, and nausea. She will also occasionally have a visual aura and has had 3 migraines which were associated with aphasia.   She takes Imitrex 100 mg PRN which does help relieve her headache, but makes her feel "disconnected".   Headache History: Onset: age 89 Triggers: stress, lack of sleep, dehydration, computer screens Aura: spots in her vision, had 3 episodes with aphasia Associated Symptoms:             Photophobia: yes             Phonophobia: yes             Nausea: yes Allodynia: yes Other symptoms: brain fog Worse  with activity?: yes Duration of headaches: 1-2 hours with medication   Migraine days per month: 12 Headache free days per month: 18   Current Treatment: Abortive Imitrex 100 mg PRN   Preventative none   Prior Therapies                                 Fioricet Imitrex 100 mg PRN  Relpax 40 mg PRN Propranolol 20 mg BID - hypotension  REVIEW OF SYSTEMS: Out of a complete 14 system review of symptoms, the patient complains only of the following symptoms, and all other reviewed systems are negative.  See HPI  ALLERGIES: Allergies  Allergen Reactions   Bee Venom Rash and Swelling    HOME MEDICATIONS: Outpatient Medications Prior to Visit  Medication Sig Dispense Refill   Atogepant (QULIPTA) 60 MG TABS Take 60 mg by mouth daily. 30 tablet 6   atomoxetine (STRATTERA) 40 MG capsule TAKE 1 CAPSULE BY MOUTH EVERY DAY 90 capsule 0   Multiple Vitamins-Minerals (MULTIVITAMIN ADULT PO) Take by mouth.     SUMAtriptan (IMITREX) 100 MG tablet TAKE 1 TABLET BY MOUTH ONCE FOR 1 DOSE, MAY REPEAT IN 2 HOURS IF HEADACHE PERSISTS OR RECURS 10 tablet 6   cholecalciferol (VITAMIN D3) 25 MCG (1000 UNIT) tablet Take 1,000 Units by mouth daily.     Magnesium 400 MG TABS Take by mouth. (Patient not  taking: Reported on 05/07/2022)     No facility-administered medications prior to visit.    PAST MEDICAL HISTORY: Past Medical History:  Diagnosis Date   Abnormal Pap smear    History of GBS (group B streptococcus) UTI, currently pregnant    with first preg   Migraines     PAST SURGICAL HISTORY: Past Surgical History:  Procedure Laterality Date   COLPOSCOPY     LAPAROSCOPIC APPENDECTOMY     uterine ablation  05/20/2016   WISDOM TOOTH EXTRACTION      FAMILY HISTORY: Family History  Problem Relation Age of Onset   Colon cancer Father 30   Colon polyps Father    Lung cancer Mother    Kidney failure Maternal Grandfather    Hypertension Maternal Grandfather    Heart failure Maternal  Grandfather    Heart disease Maternal Grandmother    Cancer Paternal Grandmother        ovarian?   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.  Tobacco Use   Smoking status: Never   Smokeless tobacco: Never  Substance and Sexual Activity   Alcohol use: Yes    Comment: Occasionally   Drug use: No   Sexual activity: Yes  Other Topics Concern   Not on file  Social History Narrative   The patient is an Therapist, sports for the [employer]   Married with 2 children, 1 son and 1 daughter   3 caffeinated beverages daily never smoker no tobacco no drug use   Social Determinants of Radio broadcast assistant Strain: Not on file  Food Insecurity: Not on file  Transportation Needs: Not on file  Physical Activity: Not on file  Stress: Not on file  Social Connections: Not on file  Intimate Partner Violence: Not on file    PHYSICAL EXAM  Vitals:   08/28/22 1252  BP: 120/80  Pulse: 80  Weight: 166 lb (75.3 kg)  Height: '5\' 4"'$  (1.626 m)   Body mass index is 28.49 kg/m.  Generalized: Well developed, in no acute distress  Neurological examination  Mentation: Alert oriented to time, place, history taking. Follows all commands speech and language fluent Cranial nerve II-XII: Pupils were equal round reactive to light. Extraocular movements were full, visual field were full on confrontational test. Facial sensation and strength were normal.  Head  turning and shoulder shrug  were normal and symmetric. Motor: The motor testing reveals 5 over 5 strength of all 4 extremities. Good symmetric motor tone is noted throughout.  Sensory: Sensory testing is intact to soft touch on all 4 extremities. No evidence of extinction is noted.  Coordination: Cerebellar testing reveals good finger-nose-finger and heel-to-shin bilaterally.  Gait and station: Gait is normal.  Reflexes: Deep tendon reflexes are symmetric and normal bilaterally.  DIAGNOSTIC DATA (LABS, IMAGING, TESTING) - I reviewed patient records, labs, notes, testing and imaging myself where available.  Lab Results  Component Value Date   WBC 5.8 03/17/2022   HGB 13.3 03/17/2022   HCT 38.9 03/17/2022   MCV 90.2 03/17/2022   PLT 277.0 03/17/2022      Component Value Date/Time   NA 137 03/17/2022 1403   K 3.7 03/17/2022 1403   CL 101 03/17/2022 1403   CO2 27 03/17/2022 1403   GLUCOSE 76 03/17/2022 1403   BUN 8 03/17/2022 1403   CREATININE 0.71 03/17/2022 1403   CALCIUM 9.3 03/17/2022 1403   PROT 7.3 03/17/2022 1403   ALBUMIN 4.1 03/17/2022 1403   AST 18 03/17/2022 1403   ALT 18 03/17/2022 1403   ALKPHOS 72 03/17/2022 1403   BILITOT 0.4 03/17/2022 1403   GFRNONAA >60 02/17/2010 0938   GFRAA  02/17/2010 0938    >60        The eGFR has been calculated using the MDRD equation. This calculation has not been validated in all clinical situations. eGFR's persistently <60 mL/min signify possible Chronic Kidney Disease.   Lab Results  Component Value Date   CHOL 160 03/17/2022   HDL 52.80 03/17/2022   LDLCALC 84 03/17/2022   TRIG 116.0 03/17/2022   CHOLHDL 3 03/17/2022   No results found for: "HGBA1C" No results found for: "VITAMINB12" Lab Results  Component Value Date   TSH 1.56 03/17/2022    Butler Denmark, AGNP-C, DNP 08/28/2022, 12:57 PM [facility] [address] [place], [place] [address] [phone]

## 2022-08-28 ENCOUNTER — Encounter: Payer: Self-pay | Admitting: Neurology

## 2022-08-28 ENCOUNTER — Ambulatory Visit: Payer: 59 | Admitting: Neurology

## 2022-08-28 ENCOUNTER — Other Ambulatory Visit (HOSPITAL_COMMUNITY): Payer: Self-pay

## 2022-08-28 ENCOUNTER — Telehealth: Payer: Self-pay

## 2022-08-28 VITALS — BP 120/80 | HR 80 | Ht 64.0 in | Wt 166.0 lb

## 2022-08-28 DIAGNOSIS — G43109 Migraine with aura, not intractable, without status migrainosus: Secondary | ICD-10-CM

## 2022-08-28 MED ORDER — SUMATRIPTAN SUCCINATE 100 MG PO TABS: ORAL_TABLET | ORAL | 11 refills | Status: DC

## 2022-08-28 MED ORDER — QULIPTA 60 MG PO TABS: 60.0000 mg | ORAL_TABLET | Freq: Every day | ORAL | 11 refills | Status: DC

## 2022-08-28 NOTE — Telephone Encounter (Signed)
Pt was seen today and states she is awaiting approval for Qulipta 60 mg, was a PA initiated?

## 2022-08-28 NOTE — Patient Instructions (Signed)
Continue the Qulipta, Imitrex  See you back in in 1 year

## 2022-08-28 NOTE — Telephone Encounter (Signed)
Pharmacy Patient Advocate Encounter   Received notification from [facility] that prior authorization for Qulipta '60MG'$  tablets is required/requested.   PA submitted on 08/28/2022 to (ins) [facility] via CoverMyMeds  Key or (Medicaid) confirmation # [facility]  Status is pending

## 2022-08-29 ENCOUNTER — Other Ambulatory Visit: Payer: Self-pay | Admitting: Neurology

## 2022-09-01 NOTE — Telephone Encounter (Signed)
PA request was sent to Prior Auth team on 08/28/22, approval is currently pending.

## 2022-09-03 ENCOUNTER — Encounter: Payer: Self-pay | Admitting: Neurology

## 2022-09-04 NOTE — Telephone Encounter (Signed)
Noted thanks °

## 2022-09-04 NOTE — Telephone Encounter (Signed)
The Insurance requested some questions to be answered.  I have faxed them in today.

## 2022-09-08 NOTE — Telephone Encounter (Signed)
Patient Advocate Encounter  Prior Authorization for Qulipta 60 mg tablets has been approved.     Insurance [employer] Effective dates: 09/04/2022 through 09/04/2023      Lyndel Safe, [place] Patient Advocate Specialist [place] Patient Advocate Team Direct Number: [phone]  Fax: [phone]

## 2022-10-06 ENCOUNTER — Telehealth: Payer: 59 | Admitting: Family Medicine

## 2022-10-06 DIAGNOSIS — R3989 Other symptoms and signs involving the genitourinary system: Secondary | ICD-10-CM | POA: Diagnosis not present

## 2022-10-06 MED ORDER — CEPHALEXIN 500 MG PO CAPS: 500.0000 mg | ORAL_CAPSULE | Freq: Two times a day (BID) | ORAL | 0 refills | Status: AC

## 2022-10-06 NOTE — Patient Instructions (Signed)
  Beverly Gray, thank you for joining [name], NP for today's virtual visit.  While this provider is not your primary care provider (PCP), if your PCP is located in our provider database this encounter information will be shared with them immediately following your visit.   A [facility] MyChart account gives you access to today's visit and all your visits, tests, and labs performed at [facility] " click here if you don't have a [facility] MyChart account or go to [email or web address]  Consent: (Patient) Beverly Gray provided verbal consent for this virtual visit at the beginning of the encounter.  Current Medications:  Current Outpatient Medications:    cephALEXin (KEFLEX) 500 MG capsule, Take 1 capsule (500 mg total) by mouth 2 (two) times daily for 7 days., Disp: 14 capsule, Rfl: 0   Atogepant (QULIPTA) 60 MG TABS, Take 1 tablet (60 mg total) by mouth daily., Disp: 30 tablet, Rfl: 11   atomoxetine (STRATTERA) 40 MG capsule, TAKE 1 CAPSULE BY MOUTH EVERY DAY, Disp: 90 capsule, Rfl: 0   Butalbital-APAP-Caffeine 50-300-40 MG CAPS, Take by mouth., Disp: , Rfl:    Ferrous Sulfate (IRON PO), Take 1 tablet by mouth daily., Disp: , Rfl:    loratadine (CLARITIN) 10 MG tablet, Take 10 mg by mouth daily., Disp: , Rfl:    Multiple Vitamins-Minerals (MULTIVITAMIN ADULT PO), Take by mouth., Disp: , Rfl:    SUMAtriptan (IMITREX) 100 MG tablet, TAKE 1 TABLET BY MOUTH ONCE FOR 1 DOSE, MAY REPEAT IN 2 HOURS IF HEADACHE PERSISTS OR RECURS, Disp: 10 tablet, Rfl: 11   Medications ordered in this encounter:  Meds ordered this encounter  Medications   cephALEXin (KEFLEX) 500 MG capsule    Sig: Take 1 capsule (500 mg total) by mouth 2 (two) times daily for 7 days.    Dispense:  14 capsule    Refill:  0    Order Specific Question:   Supervising Provider    Answer:   Merrilee Jansky [id number]     *If you need refills on other medications prior to your next  appointment, please contact your pharmacy*  Follow-Up: Call back or seek an in-person evaluation if the symptoms worsen or if the condition fails to improve as anticipated.  [facility] [phone]  Other Instructions  -increase fluids -take meds as directed   Follow up if not improving  If you have been instructed to have an in-person evaluation today at a local Urgent Care facility, please use the link below. It will take you to a list of all of our available [facility], including address, phone number and hours of operation. Please do not delay care.  [facility]  If you or a family member do not have a primary care provider, use the link below to schedule a visit and establish care. When you choose a [facility] primary care physician or advanced practice provider, you gain a long-term partner in health. Find a Primary Care Provider  Learn more about [facility]'s in-office and virtual care options: [facility] - Get Care Now

## 2022-10-06 NOTE — Progress Notes (Signed)
Virtual Visit Consent   [name], you are scheduled for a virtual visit with a [facility] provider today. Just as with appointments in the office, your consent must be obtained to participate. Your consent will be active for this visit and any virtual visit you may have with one of our providers in the next 365 days. If you have a MyChart account, a copy of this consent can be sent to you electronically.  As this is a virtual visit, video technology does not allow for your provider to perform a traditional examination. This may limit your provider's ability to fully assess your condition. If your provider identifies any concerns that need to be evaluated in person or the need to arrange testing (such as labs, EKG, etc.), we will make arrangements to do so. Although advances in technology are sophisticated, we cannot ensure that it will always work on either your end or our end. If the connection with a video visit is poor, the visit may have to be switched to a telephone visit. With either a video or telephone visit, we are not always able to ensure that we have a secure connection.  By engaging in this virtual visit, you consent to the provision of healthcare and authorize for your insurance to be billed (if applicable) for the services provided during this visit. Depending on your insurance coverage, you may receive a charge related to this service.  I need to obtain your verbal consent now. Are you willing to proceed with your visit today? [name] has provided verbal consent on 10/06/2022 for a virtual visit (video or telephone). [name], NP  Date: 10/06/2022 11:43 AM  Virtual Visit via Video Note   I, [name], connected with  Beverly Gray  ([id number], April 19, 1976) on 10/06/22 at 11:45 AM EDT by a video-enabled telemedicine application and verified that I am speaking with the correct person using two identifiers.  Location: Patient: Virtual Visit  Location Patient: Home Provider: Virtual Visit Location Provider: Home Office   I discussed the limitations of evaluation and management by telemedicine and the availability of in person appointments. The patient expressed understanding and agreed to proceed.    History of Present Illness: Beverly Gray is a 47 y.o. who identifies as a female who was assigned female at birth, and is being seen today for UTi symptoms  Onset was yesterday evening- increased urination Associated symptoms are smell was a little off, burning, frequency, can't fully empty bladder, urine pinkish, mild cramping. Modifying factors are drinking a lot of water. AZO  Denies no flank pain, fevers, chills, vaginal discharge or ithcing or sores, n/v, GI symptom, no history of stones, is sexual active, had an ablation is not pregnant     Problems:  Patient Active Problem List   Diagnosis Date Noted   ADHD 02/09/2020   Vitamin D deficiency 06/18/2018   Physical exam 06/11/2016   Migraine with aura and without status migrainosus 11/09/2015    Allergies:  Allergies  Allergen Reactions   Bee Venom Rash and Swelling   Medications:  Current Outpatient Medications:    Atogepant (QULIPTA) 60 MG TABS, Take 1 tablet (60 mg total) by mouth daily., Disp: 30 tablet, Rfl: 11   atomoxetine (STRATTERA) 40 MG capsule, TAKE 1 CAPSULE BY MOUTH EVERY DAY, Disp: 90 capsule, Rfl: 0   Butalbital-APAP-Caffeine 50-300-40 MG CAPS, Take by mouth., Disp: , Rfl:    Ferrous Sulfate (IRON PO), Take 1 tablet by mouth  daily., Disp: , Rfl:    loratadine (CLARITIN) 10 MG tablet, Take 10 mg by mouth daily., Disp: , Rfl:    Multiple Vitamins-Minerals (MULTIVITAMIN ADULT PO), Take by mouth., Disp: , Rfl:    SUMAtriptan (IMITREX) 100 MG tablet, TAKE 1 TABLET BY MOUTH ONCE FOR 1 DOSE, MAY REPEAT IN 2 HOURS IF HEADACHE PERSISTS OR RECURS, Disp: 10 tablet, Rfl: 11  Observations/Objective: Patient is well-developed, well-nourished in no acute  distress.  Resting comfortably  at home.  Head is normocephalic, atraumatic.  No labored breathing.  Speech is clear and coherent with logical content.  Patient is alert and oriented at baseline.    Assessment and Plan:  1. Suspected UTI  - cephALEXin (KEFLEX) 500 MG capsule; Take 1 capsule (500 mg total) by mouth 2 (two) times daily for 7 days.  Dispense: 14 capsule; Refill: 0  -no other red flags for stone or kidney infection -increase fluids -complete medication as discussed -prevention discussed and on AVS  Reviewed side effects, risks and benefits of medication.    Patient acknowledged agreement and understanding of the plan.   Past Medical, Surgical, Social History, Allergies, and Medications have been Reviewed.    Follow Up Instructions: I discussed the assessment and treatment plan with the patient. The patient was provided an opportunity to ask questions and all were answered. The patient agreed with the plan and demonstrated an understanding of the instructions.  A copy of instructions were sent to the patient via MyChart unless otherwise noted below.     The patient was advised to call back or seek an in-person evaluation if the symptoms worsen or if the condition fails to improve as anticipated.  Time:  I spent 7 minutes with the patient via telehealth technology discussing the above problems/concerns.    [name], NP

## 2022-10-29 DIAGNOSIS — N393 Stress incontinence (female) (male): Secondary | ICD-10-CM | POA: Insufficient documentation

## 2022-10-30 ENCOUNTER — Other Ambulatory Visit: Payer: Self-pay | Admitting: Family Medicine

## 2022-10-30 MED ORDER — ATOMOXETINE HCL 40 MG PO CAPS: 40.0000 mg | ORAL_CAPSULE | Freq: Every day | ORAL | 1 refills | Status: AC

## 2022-10-30 NOTE — Telephone Encounter (Signed)
Patient is requesting a refill of the following medications: Requested Prescriptions   Pending Prescriptions Disp Refills   atomoxetine (STRATTERA) 40 MG capsule      Sig: Take by mouth daily.    Date of patient request: 10/30/22 Last office visit: 03/17/2022 Date of last refill: 03/20/2022 Last refill amount: 90 Follow up time period per chart: 6 months

## 2022-10-31 ENCOUNTER — Other Ambulatory Visit: Payer: Self-pay | Admitting: Obstetrics and Gynecology

## 2022-10-31 DIAGNOSIS — R928 Other abnormal and inconclusive findings on diagnostic imaging of breast: Secondary | ICD-10-CM

## 2022-11-17 ENCOUNTER — Other Ambulatory Visit: Payer: Self-pay | Admitting: Obstetrics and Gynecology

## 2022-11-17 ENCOUNTER — Ambulatory Visit
Admission: RE | Admit: 2022-11-17 | Discharge: 2022-11-17 | Disposition: A | Discharge status: Home / Self Care | Payer: 59 | Source: Ambulatory Visit | Attending: Obstetrics and Gynecology | Admitting: Obstetrics and Gynecology

## 2022-11-17 DIAGNOSIS — R928 Other abnormal and inconclusive findings on diagnostic imaging of breast: Secondary | ICD-10-CM

## 2022-11-17 DIAGNOSIS — N6320 Unspecified lump in the left breast, unspecified quadrant: Secondary | ICD-10-CM

## 2022-12-08 ENCOUNTER — Encounter: Payer: Self-pay | Admitting: Family Medicine

## 2022-12-10 ENCOUNTER — Other Ambulatory Visit: Payer: Self-pay

## 2022-12-10 ENCOUNTER — Other Ambulatory Visit: Payer: 59

## 2022-12-10 DIAGNOSIS — Z111 Encounter for screening for respiratory tuberculosis: Secondary | ICD-10-CM

## 2022-12-10 NOTE — Addendum Note (Signed)
Addended by: Lenard Simmer on: 12/10/2022 10:55 AM   Modules accepted: Orders

## 2022-12-12 LAB — QUANTIFERON-TB GOLD PLUS
Mitogen-NIL: 8.42 IU/mL
NIL: 0.02 IU/mL
QuantiFERON-TB Gold Plus: NEGATIVE
TB1-NIL: 0.02 IU/mL
TB2-NIL: 0.02 IU/mL

## 2022-12-16 ENCOUNTER — Telehealth: Payer: Self-pay

## 2022-12-16 NOTE — Telephone Encounter (Signed)
Left results on pt VM  

## 2022-12-16 NOTE — Telephone Encounter (Signed)
-----   Message from Sheliah Hatch, MD sent at 12/15/2022  4:24 PM EDT ----- Normal TB test- great news!

## 2023-01-21 ENCOUNTER — Other Ambulatory Visit: Payer: Self-pay | Admitting: Psychiatry

## 2023-01-21 NOTE — Telephone Encounter (Signed)
Last seen on 08/28/22 per note "Continue the Qulipta, Imitrex " Follow up scheduled on 09/01/23

## 2023-02-09 ENCOUNTER — Encounter: Payer: Self-pay | Admitting: Family Medicine

## 2023-02-09 ENCOUNTER — Ambulatory Visit: Payer: 59 | Admitting: Family Medicine

## 2023-02-09 VITALS — BP 124/80 | HR 79 | Temp 97.9°F | Wt 168.4 lb

## 2023-02-09 DIAGNOSIS — L299 Pruritus, unspecified: Secondary | ICD-10-CM | POA: Diagnosis not present

## 2023-02-09 DIAGNOSIS — M2550 Pain in unspecified joint: Secondary | ICD-10-CM | POA: Diagnosis not present

## 2023-02-09 LAB — BASIC METABOLIC PANEL
BUN: 12 mg/dL (ref 6–23)
CO2: 27 mEq/L (ref 19–32)
Calcium: 9.6 mg/dL (ref 8.4–10.5)
Chloride: 101 mEq/L (ref 96–112)
Creatinine, Ser: 0.78 mg/dL (ref 0.40–1.20)
GFR: 91.62 mL/min (ref >60.00)
Glucose, Bld: 85 mg/dL (ref 70–99)
Potassium: 3.9 mEq/L (ref 3.5–5.1)
Sodium: 135 mEq/L (ref 135–145)

## 2023-02-09 LAB — CBC WITH DIFFERENTIAL/PLATELET
Basophils Absolute: 0 10*3/uL (ref 0.0–0.1)
Basophils Relative: 0.6 % (ref 0.0–3.0)
Eosinophils Absolute: 0.1 10*3/uL (ref 0.0–0.7)
Eosinophils Relative: 3.1 % (ref 0.0–5.0)
HCT: 41.2 % (ref 36.0–46.0)
Hemoglobin: 13.7 g/dL (ref 12.0–15.0)
Lymphocytes Relative: 43.1 % (ref 12.0–46.0)
Lymphs Abs: 1.8 10*3/uL (ref 0.7–4.0)
MCHC: 33.3 g/dL (ref 30.0–36.0)
MCV: 92.2 fl (ref 78.0–100.0)
Monocytes Absolute: 0.5 10*3/uL (ref 0.1–1.0)
Monocytes Relative: 10.8 % (ref 3.0–12.0)
Neutro Abs: 1.8 10*3/uL (ref 1.4–7.7)
Neutrophils Relative %: 42.4 % — ABNORMAL LOW (ref 43.0–77.0)
Platelets: 286 10*3/uL (ref 150.0–400.0)
RBC: 4.46 Mil/uL (ref 3.87–5.11)
RDW: 11.8 % (ref 11.5–15.5)
WBC: 4.2 10*3/uL (ref 4.0–10.5)

## 2023-02-09 LAB — HEPATIC FUNCTION PANEL
ALT: 16 U/L (ref 0–35)
AST: 20 U/L (ref 0–37)
Albumin: 4.4 g/dL (ref 3.5–5.2)
Alkaline Phosphatase: 70 U/L (ref 39–117)
Bilirubin, Direct: 0.1 mg/dL (ref 0.0–0.3)
Total Bilirubin: 0.7 mg/dL (ref 0.2–1.2)
Total Protein: 7.3 g/dL (ref 6.0–8.3)

## 2023-02-09 LAB — SEDIMENTATION RATE: Sed Rate: 11 mm/hr (ref 0–20)

## 2023-02-09 MED ORDER — HYDROXYZINE PAMOATE 25 MG PO CAPS: 25.0000 mg | ORAL_CAPSULE | Freq: Three times a day (TID) | ORAL | 0 refills | Status: AC | PRN

## 2023-02-09 MED ORDER — FAMOTIDINE 40 MG PO TABS: 40.0000 mg | ORAL_TABLET | Freq: Every day | ORAL | 1 refills | Status: DC

## 2023-02-09 NOTE — Progress Notes (Signed)
   Subjective:    Patient ID: Beverly Gray, female    DOB: January 31, 1976, 47 y.o.   MRN: [id number]  HPI Itching- sxs started earlier this summer.  Abrupt onset.  Has never seen a rash.  Itching is constant.  Pt did get a tick bite in early June.  Is eating red meat.  + increased GERD.  Some relief w/ Xyzal.  Now having joint pain in 3 separate PIP joints.   Review of Systems For ROS see HPI     Objective:   Physical Exam Vitals reviewed.  Constitutional:      General: She is not in acute distress.    Appearance: Normal appearance. She is not ill-appearing.  HENT:     Head: Normocephalic and atraumatic.  Eyes:     Extraocular Movements: Extraocular movements intact.     Conjunctiva/sclera: Conjunctivae normal.  Cardiovascular:     Rate and Rhythm: Normal rate and regular rhythm.  Pulmonary:     Effort: Pulmonary effort is normal. No respiratory distress.  Musculoskeletal:        General: Tenderness (TTP over multiple DIP joints) present.     Right lower leg: No edema.     Left lower leg: No edema.  Skin:    General: Skin is warm and dry.     Findings: No erythema or rash.     Comments: Pt is scratching all over during entire visit  Neurological:     General: No focal deficit present.     Mental Status: She is alert and oriented to person, place, and time.  Psychiatric:        Mood and Affect: Mood normal.        Behavior: Behavior normal.        Thought Content: Thought content normal.           Assessment & Plan:  Itching/Polyarthralgia- new.  Both things seemed to start after tick bite in June.  Will start Hydroxyzine for itching and Pepcid to help block histamine response.  Check labs to assess for possible alpha gal or autoimmune condition.  Refer to allergy for complete evaluation.  Pt expressed understanding and is in agreement w/ plan.

## 2023-02-09 NOTE — Patient Instructions (Addendum)
Follow up as needed or as scheduled We'll notify you of your lab results and make any changes if needed CONTINUE the Xyzal daily ADD Famotidine daily USE the Hydroxyzine as needed- may cause drowsiness We'll call you to schedule your allergy appt Call with any questions or concerns Hang in there!!!

## 2023-02-10 ENCOUNTER — Telehealth: Payer: Self-pay

## 2023-02-10 NOTE — Telephone Encounter (Signed)
Pt seen results Via my chart  

## 2023-02-10 NOTE — Telephone Encounter (Signed)
-----   Message from Neena Rhymes sent at 02/09/2023  8:40 PM EDT ----- Labs look great so far!  Waiting on alpha gal and ANA.Marland KitchenMarland KitchenMarland Kitchen

## 2023-02-11 ENCOUNTER — Telehealth: Payer: Self-pay

## 2023-02-11 NOTE — Telephone Encounter (Signed)
Pt is aware of lab results.

## 2023-02-11 NOTE — Telephone Encounter (Signed)
-----   Message from Neena Rhymes sent at 02/11/2023  7:43 AM EDT ----- Negative ANA- this means no obvious autoimmune condition.  So far so good!

## 2023-02-16 ENCOUNTER — Telehealth: Payer: Self-pay

## 2023-02-16 NOTE — Telephone Encounter (Signed)
Pt seen results Via my chart  

## 2023-02-16 NOTE — Telephone Encounter (Signed)
-----   Message from Neena Rhymes sent at 02/16/2023  7:51 AM EDT ----- Bonita Quin are showing a low levels of antibodies to beef and lamb.  At this time, I would avoid those meats until you see the allergist.  Sherri Rad in there!

## 2023-02-25 NOTE — Progress Notes (Unsigned)
New Patient Note  RE: Beverly Gray MRN: [id number] DOB: [birth year] Date of Office Visit: 02/26/2023  Consult requested by: Sheliah Hatch, MD Primary care provider: Sheliah Hatch, MD  Chief Complaint: Pruritus  History of Present Illness: I had the pleasure of seeing Beverly Gray for initial evaluation at the [facility] on 02/26/2023. She is a 47 y.o. female, who is referred here by Sheliah Hatch, MD for the evaluation of pruritus.  Itching started in June 2024. This can occur anywhere on her body. No rash with this. Associated symptoms include: increased heartburn and diarrhea if the itching is worse.  Joint poin in the hands when itching is really bad.  Frequency of episodes: daily throughout the day. Suspected triggers are unknown. Denies any fevers, chills, changes in medications, foods, personal care products or recent infections. She has tried the following therapies: Xyzal with good benefit. Systemic steroids: none. Currently on Xyzal 5mg  every other day.  Previous work up includes: 2024 bloodwork sed rate, ANA, alpha gal, CBC diff, CMP. Patient had tick bite but doesn't really much red meat.  Previous history of rash/hives/pruritus: none. Patient is up to date with the following cancer screening tests: physical exam, mammogram, pap smears, colonoscopy.  Assessment and Plan: Beverly Gray is a 47 y.o. female with: Pruritus Daily pruritus starting June 2024. Sometimes gets heartburn and diarrhea when itching is extreme. Denies changes in diet, meds, personal care products. Symptoms free when takes Xyzal daily. 2024 bloodwork sed rate, ANA, alpha gal, CBC diff, CMP unremarkable.  Etiology unclear but responsive to antihistamines. Start Xyzal 5mg  daily and may take twice a day if needed.  If symptoms are not controlled or causes drowsiness let us know. May take Pepcid (famotidine) 20mg  to 40mg  daily as needed.  Avoid the following  potential triggers: alcohol, tight clothing, NSAIDs, hot showers and getting overheated. See below for proper skin care.  Get bloodwork.  Other allergic rhinitis Minimal symptoms in the spring and sometimes in the fall. Unable to skin test today as Beverly Gray won't allow new patient visits and procedures on the same day.  Patient will check cost of doing bloodwork vs returning for skin testing.   Return in about 4 weeks (around 03/26/2023) for Skin testing.  No orders of the defined types were placed in this encounter.  Lab Orders         Allergens w/Total IgE Area 2         C3 and C4         Chronic Urticaria         C-reactive protein         Tryptase         Thyroid Cascade Profile     Other allergy screening: Asthma: no Rhino conjunctivitis:  minimal symptoms in the spring and sometimes in the fall. Food allergy: no Medication allergy: no Hymenoptera allergy: large localized reaction Urticaria: no Eczema:no History of recurrent infections suggestive of immunodeficency: no  Diagnostics: None.   Past Medical History: Patient Active Problem List   Diagnosis Date Noted   ADHD 02/09/2020   Vitamin D deficiency 06/18/2018   Physical exam 06/11/2016   Migraine with aura and without status migrainosus 11/09/2015   Past Medical History:  Diagnosis Date   Abnormal Pap smear    History of GBS (group B streptococcus) UTI, currently pregnant    with first preg   Migraines    Past Surgical History: Past Surgical History:  Procedure Laterality Date   COLPOSCOPY     LAPAROSCOPIC APPENDECTOMY     uterine ablation  05/20/2016   WISDOM TOOTH EXTRACTION     Medication List:  Current Outpatient Medications  Medication Sig Dispense Refill   Atogepant (QULIPTA) 60 MG TABS TAKE 60 MG BY MOUTH DAILY. 90 tablet 1   atomoxetine (STRATTERA) 40 MG capsule Take 1 capsule (40 mg total) by mouth daily. 90 capsule 1   famotidine (PEPCID) 40 MG tablet Take 1 tablet (40 mg total) by mouth  daily. 90 tablet 1   hydrOXYzine (VISTARIL) 25 MG capsule Take 1 capsule (25 mg total) by mouth every 8 (eight) hours as needed. 60 capsule 0   Levocetirizine Dihydrochloride (XYZAL ALLERGY 24HR PO) Take by mouth.     Multiple Vitamins-Minerals (MULTIVITAMIN ADULT PO) Take by mouth.     SUMAtriptan (IMITREX) 100 MG tablet TAKE 1 TABLET BY MOUTH ONCE FOR 1 DOSE, MAY REPEAT IN 2 HOURS IF HEADACHE PERSISTS OR RECURS 10 tablet 11   No current facility-administered medications for this visit.   Allergies: Allergies  Allergen Reactions   Bee Venom Rash and Swelling   Social History: Social History   Socioeconomic History   Marital status: Married    Spouse name: Not on file   Number of children: 2   Years of education: Not on file   Highest education level: Not on file  Occupational History   Occupation: Teacher, adult education: [employer]    Comment: works in [employer].  Tobacco Use   Smoking status: Never   Smokeless tobacco: Never  Substance and Sexual Activity   Alcohol use: Yes    Comment: Occasionally   Drug use: No   Sexual activity: Yes  Other Topics Concern   Not on file  Social History Narrative   The patient is an Charity fundraiser for the [employer]   Married with 2 children, 1 son and 1 daughter   3 caffeinated beverages daily never smoker no tobacco no drug use   Social Determinants of Corporate investment banker Strain: Not on file  Food Insecurity: Not on file  Transportation Needs: Not on file  Physical Activity: Not on file  Stress: Not on file  Social Connections: Not on file   Lives in a house. Smoking: denies Occupation: NP  Environmental HistorySurveyor, minerals in the house:  not sure Carpet in the family room: no Carpet in the bedroom: yes Heating: gas Cooling: central Pet: yes 1 dog x 7+ yrs, turtle x 6 months, fish  Family History: Family History  Problem Relation Age of Onset   Colon cancer Father 4   Colon polyps Father     Lung cancer Mother    Kidney failure Maternal Grandfather    Hypertension Maternal Grandfather    Heart failure Maternal Grandfather    Heart disease Maternal Grandmother    Cancer Paternal Grandmother        ovarian?   Cancer Paternal Grandfather        throat   Esophageal cancer Paternal Grandfather    Spina bifida Sister    Breast cancer Paternal Aunt    Ulcerative colitis Paternal Aunt    Sjogren's syndrome Paternal Aunt    Rectal cancer Neg Hx    Stomach cancer Neg Hx    Problem  Relation Asthma                                   Mother, father, son  Eczema                                son Food allergy                          no Allergic rhino conjunctivitis     son  Review of Systems  Constitutional:  Negative for appetite change, chills, fever and unexpected weight change.  HENT:  Negative for congestion and rhinorrhea.   Eyes:  Negative for itching.  Respiratory:  Negative for cough, chest tightness, shortness of breath and wheezing.   Cardiovascular:  Negative for chest pain.  Gastrointestinal:  Positive for diarrhea and nausea. Negative for abdominal pain, constipation and vomiting.  Genitourinary:  Negative for difficulty urinating.  Skin:  Negative for rash.       pruritis  Neurological:  Negative for headaches.    Objective: BP 110/72   Pulse 94   Temp 98.4 F (36.9 C) (Temporal)   Resp 18   Ht 5' 4.5" (1.638 m)   Wt 166 lb 12.8 oz (75.7 kg)   SpO2 96%   BMI 28.19 kg/m  Body mass index is 28.19 kg/m. Physical Exam Vitals and nursing note reviewed.  Constitutional:      Appearance: Normal appearance. She is well-developed.  HENT:     Head: Normocephalic and atraumatic.     Right Ear: Tympanic membrane and external ear normal.     Left Ear: Tympanic membrane and external ear normal.     Nose: Nose normal.     Mouth/Throat:     Mouth: Mucous membranes are moist.     Pharynx: Oropharynx is clear.  Eyes:      Conjunctiva/sclera: Conjunctivae normal.  Cardiovascular:     Rate and Rhythm: Normal rate and regular rhythm.     Heart sounds: Normal heart sounds. No murmur heard.    No friction rub. No gallop.  Pulmonary:     Effort: Pulmonary effort is normal.     Breath sounds: Normal breath sounds. No wheezing, rhonchi or rales.  Musculoskeletal:     Cervical back: Neck supple.  Skin:    General: Skin is warm.     Findings: No rash.     Comments: Actively itching during OV.  Neurological:     Mental Status: She is alert and oriented to person, place, and time.  Psychiatric:        Behavior: Behavior normal.   The plan was reviewed with the patient/family, and all questions/concerned were addressed.  It was my pleasure to see Beverly Gray today and participate in her care. Please feel free to contact me with any questions or concerns.  Sincerely,  Wyline Mood, DO Allergy & Immunology  [facility] office: [phone] [facility] Gray office: [phone]

## 2023-02-26 ENCOUNTER — Encounter: Payer: Self-pay | Admitting: Allergy

## 2023-02-26 ENCOUNTER — Ambulatory Visit: Payer: 59 | Admitting: Allergy

## 2023-02-26 VITALS — BP 110/72 | HR 94 | Temp 98.4°F | Resp 18 | Ht 64.5 in | Wt 166.8 lb

## 2023-02-26 DIAGNOSIS — J3089 Other allergic rhinitis: Secondary | ICD-10-CM | POA: Diagnosis not present

## 2023-02-26 DIAGNOSIS — L299 Pruritus, unspecified: Secondary | ICD-10-CM | POA: Diagnosis not present

## 2023-02-26 NOTE — Patient Instructions (Addendum)
  Itching Etiology unclear but seems to very responsive to antihistamines. Start Xyzal 5mg  daily and may take twice a day if needed.  If symptoms are not controlled or causes drowsiness let us know. May take Pepcid (famotidine) 20mg  to 40mg  daily as needed.  Avoid the following potential triggers: alcohol, tight clothing, NSAIDs, hot showers and getting overheated. See below for proper skin care.  Get bloodwork. We are ordering labs, so please allow 1-2 weeks for the results to come back. With the newly implemented Cures Act, the labs might be visible to you at the same time that they become visible to me. However, I will not address the results until all of the results are back, so please be patient.   Return in about 4 weeks (around 03/26/2023). Or sooner if needed.  Skin care recommendations  Bath time: Always use lukewarm water. AVOID very hot or cold water. Keep bathing time to 5-10 minutes. Do NOT use bubble bath. Use a mild soap and use just enough to wash the dirty areas. Do NOT scrub skin vigorously.  After bathing, pat dry your skin with a towel. Do NOT rub or scrub the skin.  Moisturizers and prescriptions:  ALWAYS apply moisturizers immediately after bathing (within 3 minutes). This helps to lock-in moisture. Use the moisturizer several times a day over the whole body. Good summer moisturizers include: Aveeno, CeraVe, Cetaphil. Good winter moisturizers include: Aquaphor, Vaseline, Cerave, Cetaphil, Eucerin, Vanicream. When using moisturizers along with medications, the moisturizer should be applied about one hour after applying the medication to prevent diluting effect of the medication or moisturize around where you applied the medications. When not using medications, the moisturizer can be continued twice daily as maintenance.  Laundry and clothing: Avoid laundry products with added color or perfumes. Use unscented hypo-allergenic laundry products such as Tide free, Cheer  free & gentle, and All free and clear.  If the skin still seems dry or sensitive, you can try double-rinsing the clothes. Avoid tight or scratchy clothing such as wool. Do not use fabric softeners or dyer sheets.

## 2023-03-23 LAB — ALLERGENS W/TOTAL IGE AREA 2

## 2023-03-26 LAB — C-REACTIVE PROTEIN: CRP: <1 mg/L (ref 0–10)

## 2023-03-26 LAB — THYROID CASCADE PROFILE: TSH: 1.05 u[IU]/mL (ref 0.450–4.500)

## 2023-03-26 LAB — CHRONIC URTICARIA: cu index: 8.5 (ref <10)

## 2023-03-26 LAB — C3 AND C4
Complement C3, Serum: 132 mg/dL (ref 82–167)
Complement C4, Serum: 27 mg/dL (ref 12–38)

## 2023-03-26 LAB — TRYPTASE: Tryptase: 6.1 ug/L (ref 2.2–13.2)

## 2023-04-01 NOTE — Progress Notes (Unsigned)
   Skin testing note  RE: Beverly Gray MRN: [id number] DOB: 06-06-1976 Date of Office Visit: 04/02/2023  Referring provider: Sheliah Hatch, MD Primary care provider: Sheliah Hatch, MD  Chief Complaint: No chief complaint on file.  History of Present Illness: I had the pleasure of seeing Beverly Gray for a skin testing visit at the [facility] on 04/01/2023. She is a 47 y.o. female, who is being followed for pruritus and allergic rhinitis. Her previous allergy office visit was on 02/26/2023 with Dr. Selena Gray. Today is a skin testing visit.   Pruritus Daily pruritus starting June 2024. Sometimes gets heartburn and diarrhea when itching is extreme. Denies changes in diet, meds, personal care products. Symptoms free when takes Xyzal daily. 2024 bloodwork sed rate, ANA, alpha gal, CBC diff, CMP unremarkable.  Etiology unclear but responsive to antihistamines. Start Xyzal 5mg  daily and may take twice a day if needed.  If symptoms are not controlled or causes drowsiness let us know. May take Pepcid (famotidine) 20mg  to 40mg  daily as needed.  Avoid the following potential triggers: alcohol, tight clothing, NSAIDs, hot showers and getting overheated. See below for proper skin care.  Get bloodwork.   Other allergic rhinitis Minimal symptoms in the spring and sometimes in the fall. Unable to skin test today as [facility] won't allow new patient visits and procedures on the same day.  Patient will check cost of doing bloodwork vs returning for skin testing.  I reviewed the bloodwork. Thyroid, inflammation markers, chronic urticaria index (checks for autoantibodies that trigger mast cells), tryptase (checks for mast cell issues) were all normal which is great.   Negative to environmental panel.   Based on these results, no underlying issues found. Continue medications as discussed at last visit.  Assessment and Plan: Beverly Gray is a 47 y.o. female with: ***  No  follow-ups on file.  No orders of the defined types were placed in this encounter.  Lab Orders  No laboratory test(s) ordered today    Diagnostics: Spirometry:  Tracings reviewed. Her effort: {Blank single:19197::"Good reproducible efforts.","It was hard to get consistent efforts and there is a question as to whether this reflects a maximal maneuver.","Poor effort, data can not be interpreted."} FVC: ***L FEV1: ***L, ***% predicted FEV1/FVC ratio: ***% Interpretation: {Blank single:19197::"Spirometry consistent with mild obstructive disease","Spirometry consistent with moderate obstructive disease","Spirometry consistent with severe obstructive disease","Spirometry consistent with possible restrictive disease","Spirometry consistent with mixed obstructive and restrictive disease","Spirometry uninterpretable due to technique","Spirometry consistent with normal pattern","No overt abnormalities noted given today's efforts"}.  Please see scanned spirometry results for details.  Skin Testing: {Blank single:19197::"Select foods","Environmental allergy panel","Environmental allergy panel and select foods","Food allergy panel","None","Deferred due to recent antihistamines use"}. *** Results discussed with patient/family.   Previous notes and tests were reviewed. The plan was reviewed with the patient/family, and all questions/concerned were addressed.  It was my pleasure to see Beverly Gray today and participate in her care. Please feel free to contact me with any questions or concerns.  Sincerely,  Wyline Mood, DO Allergy & Immunology  [facility] office: [phone] [facility] office: [phone]

## 2023-04-02 ENCOUNTER — Ambulatory Visit: Payer: 59 | Admitting: Allergy

## 2023-04-02 ENCOUNTER — Other Ambulatory Visit: Payer: Self-pay

## 2023-04-02 ENCOUNTER — Encounter: Payer: Self-pay | Admitting: Allergy

## 2023-04-02 VITALS — BP 104/66 | HR 73 | Temp 98.1°F | Resp 16

## 2023-04-02 DIAGNOSIS — J310 Chronic rhinitis: Secondary | ICD-10-CM | POA: Diagnosis not present

## 2023-04-02 DIAGNOSIS — L299 Pruritus, unspecified: Secondary | ICD-10-CM | POA: Diagnosis not present

## 2023-04-02 NOTE — Patient Instructions (Addendum)
Itching Etiology unclear but seems to very responsive to antihistamines. Labwork unremarkable.  Continue Xyzal 5mg  daily and may take twice a day if needed.  If symptoms are not controlled or causes drowsiness let us know. May take Pepcid (famotidine) 20mg  to 40mg  daily as needed.  Avoid the following potential triggers: alcohol, tight clothing, NSAIDs, hot showers and getting overheated. Continue proper skin care.   Return in about 1 year (around 04/01/2024). Or sooner if needed.  Skin care recommendations  Bath time: Always use lukewarm water. AVOID very hot or cold water. Keep bathing time to 5-10 minutes. Do NOT use bubble bath. Use a mild soap and use just enough to wash the dirty areas. Do NOT scrub skin vigorously.  After bathing, pat dry your skin with a towel. Do NOT rub or scrub the skin.  Moisturizers and prescriptions:  ALWAYS apply moisturizers immediately after bathing (within 3 minutes). This helps to lock-in moisture. Use the moisturizer several times a day over the whole body. Good summer moisturizers include: Aveeno, CeraVe, Cetaphil. Good winter moisturizers include: Aquaphor, Vaseline, Cerave, Cetaphil, Eucerin, Vanicream. When using moisturizers along with medications, the moisturizer should be applied about one hour after applying the medication to prevent diluting effect of the medication or moisturize around where you applied the medications. When not using medications, the moisturizer can be continued twice daily as maintenance.  Laundry and clothing: Avoid laundry products with added color or perfumes. Use unscented hypo-allergenic laundry products such as Tide free, Cheer free & gentle, and All free and clear.  If the skin still seems dry or sensitive, you can try double-rinsing the clothes. Avoid tight or scratchy clothing such as wool. Do not use fabric softeners or dyer sheets.

## 2023-05-06 ENCOUNTER — Telehealth: Payer: Self-pay

## 2023-05-06 NOTE — Telephone Encounter (Signed)
Sent to PA team

## 2023-05-07 ENCOUNTER — Other Ambulatory Visit (HOSPITAL_COMMUNITY): Payer: Self-pay

## 2023-05-07 ENCOUNTER — Telehealth: Payer: Self-pay

## 2023-05-07 NOTE — Telephone Encounter (Signed)
Pt has been notified.

## 2023-05-07 NOTE — Telephone Encounter (Signed)
Pharmacy Patient Advocate Encounter   Received notification from Pt Calls Messages that prior authorization for Atomoxetine HCl 40MG  capsules is required/requested.   Insurance verification completed.   The patient is insured through [facility] .   Per test claim: APPROVED from 05/07/23 to 05/06/26. Ran test claim, Copay is $15. This test claim was processed through [facility]- copay amounts may vary at other pharmacies due to pharmacy/plan contracts, or as the patient moves through the different stages of their insurance plan.    KeyMarland Kitchen PA Case ID #: [id number]

## 2023-05-07 NOTE — Telephone Encounter (Signed)
PA request has been Approved. New Encounter created for follow up. For additional info see Pharmacy Prior Auth telephone encounter from 05/07/23.

## 2023-05-20 ENCOUNTER — Other Ambulatory Visit: Payer: Self-pay | Admitting: Family Medicine

## 2023-05-21 ENCOUNTER — Encounter: Payer: Self-pay | Admitting: Family Medicine

## 2023-05-21 ENCOUNTER — Other Ambulatory Visit: Payer: Self-pay | Admitting: Obstetrics and Gynecology

## 2023-05-21 ENCOUNTER — Ambulatory Visit
Admission: RE | Admit: 2023-05-21 | Discharge: 2023-05-21 | Disposition: A | Discharge status: Home / Self Care | Payer: 59 | Source: Ambulatory Visit | Attending: Obstetrics and Gynecology | Admitting: Obstetrics and Gynecology

## 2023-05-21 ENCOUNTER — Encounter: Payer: Self-pay | Admitting: Obstetrics and Gynecology

## 2023-05-21 DIAGNOSIS — N6320 Unspecified lump in the left breast, unspecified quadrant: Secondary | ICD-10-CM

## 2023-08-03 ENCOUNTER — Encounter: Payer: Self-pay | Admitting: Neurology

## 2023-08-04 ENCOUNTER — Other Ambulatory Visit: Payer: Self-pay | Admitting: Family Medicine

## 2023-08-04 MED ORDER — QULIPTA 60 MG PO TABS: 1.0000 | ORAL_TABLET | Freq: Every day | ORAL | 1 refills | Status: DC

## 2023-09-01 ENCOUNTER — Ambulatory Visit: Payer: 59 | Admitting: Neurology

## 2023-09-01 NOTE — Progress Notes (Unsigned)
 Patient: Beverly Gray Date of Birth: April 10, 1976  Reason for Visit: Follow up History from: Patient Primary Neurologist: [facility]   ASSESSMENT AND PLAN 48 y.o. year old female   1.  Chronic migraine headache -Overall, doing great, migraines under good control -Continue Qulipta 60 mg daily for migraine preventative -Continue Imitrex as needed for acute headache, for milder headaches will use ibuprofen -Follow-up in 1 year or sooner if needed, 15 min VV  HISTORY OF PRESENT ILLNESS: Today 09/02/23 Beverly Gray has changed her life. Works Firefighter. Imitrex works well for her. She tried a sample of Nurtec, it didn't help. 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 No side effects with Qulipta.    Update 08/28/22 SS: She tried protriptyline but had dry mouth and blurry vision.  She started Qulipta 60 mg daily around December 2023.  Has had excellent benefit.  Unfortunately she has had some interruption in consistent administration due to insurance issues, stock at the pharmacy.  When on [facility] has not had any significant migraine headaches.  The headaches she has had have been very mild and has been able to take ibuprofen with good benefit.  She is pleased with her rescue medication Imitrex.  Denies any side effects of Qulipta.  She is almost finished with her DNP FNP program at [facility].  HISTORY 05/07/22 [facility] HPI:  Medical co-morbidities: anxiety   The patient presents for evaluation of headaches which began when she was 48 years old. She currently averages 1-3 migraine days per week. They have increased in frequency since she started grad school. Her migraines are associated with photophobia, phonophobia, and nausea. She will also occasionally have a visual aura and has had 3 migraines which were associated with aphasia.   She takes Imitrex 100 mg PRN which does help relieve her headache, but makes her feel "disconnected".    Headache History: Onset: age 73 Triggers: stress, lack of sleep, dehydration, computer screens Aura: spots in her vision, had 3 episodes with aphasia Associated Symptoms:             Photophobia: yes             Phonophobia: yes             Nausea: yes Allodynia: yes Other symptoms: brain fog Worse with activity?: yes Duration of headaches: 1-2 hours with medication   Migraine days per month: 12 Headache free days per month: 18   Current Treatment: Abortive Imitrex 100 mg PRN   Preventative none   Prior Therapies                                 Fioricet Imitrex 100 mg PRN  Relpax 40 mg PRN Propranolol 20 mg BID - hypotension  REVIEW OF SYSTEMS: Out of a complete 14 system review of symptoms, the patient complains only of the following symptoms, and all other reviewed systems are negative.  See HPI  ALLERGIES: Allergies  Allergen Reactions   Bee Venom Rash and Swelling    HOME MEDICATIONS: Outpatient Medications Prior to Visit  Medication Sig Dispense Refill   Atogepant (QULIPTA) 60 MG TABS Take 1 tablet (60 mg total) by mouth daily. 90 tablet 1   atomoxetine (STRATTERA) 40 MG capsule Take 1 capsule (40 mg total) by mouth daily. 90 capsule 1   famotidine (PEPCID) 40 MG tablet TAKE 1 TABLET BY MOUTH  EVERY DAY 90 tablet 1   hydrOXYzine (VISTARIL) 25 MG capsule Take 1 capsule (25 mg total) by mouth every 8 (eight) hours as needed. 60 capsule 0   Levocetirizine Dihydrochloride (XYZAL ALLERGY 24HR PO) Take by mouth.     Multiple Vitamins-Minerals (MULTIVITAMIN ADULT PO) Take by mouth.     SUMAtriptan (IMITREX) 100 MG tablet TAKE 1 TABLET BY MOUTH ONCE FOR 1 DOSE, MAY REPEAT IN 2 HOURS IF HEADACHE PERSISTS OR RECURS 10 tablet 11   No facility-administered medications prior to visit.    PAST MEDICAL HISTORY: Past Medical History:  Diagnosis Date   Abnormal Pap smear    History of GBS (group B streptococcus) UTI, currently pregnant    with first preg   Migraines      PAST SURGICAL HISTORY: Past Surgical History:  Procedure Laterality Date   COLPOSCOPY     LAPAROSCOPIC APPENDECTOMY     uterine ablation  05/20/2016   WISDOM TOOTH EXTRACTION      FAMILY HISTORY: Family History  Problem Relation Age of Onset   Colon cancer Father 67   Colon polyps Father    Lung cancer Mother    Kidney failure Maternal Grandfather    Hypertension Maternal Grandfather    Heart failure Maternal Grandfather    Heart disease Maternal Grandmother    Cancer Paternal Grandmother        ovarian?   Cancer Paternal Grandfather        throat   Esophageal cancer Paternal Grandfather    Spina bifida Sister    Breast cancer Paternal Aunt    Ulcerative colitis Paternal Aunt    Sjogren's syndrome Paternal Aunt    Rectal cancer Neg Hx    Stomach cancer Neg Hx     SOCIAL HISTORY: Social History   Socioeconomic History   Marital status: Married    Spouse name: Not on file   Number of children: 2   Years of education: Not on file   Highest education level: Not on file  Occupational History   Occupation: Teacher, adult education: [employer]    Comment: works in [employer].  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  Generalized: Well developed, in no acute distress  Neurological examination  Mentation: Alert oriented to time, place, history taking. Follows all commands speech and language fluent Cranial nerve II-XII: Pupils were equal round reactive to light. Extraocular movements were full, visual field were full on confrontational test. Facial sensation and strength were normal.  Head turning and shoulder shrug  were normal and symmetric. Motor: The motor testing reveals 5 over 5 strength of all 4 extremities. Good symmetric motor tone is noted throughout.  Sensory: Sensory testing is intact to soft touch on all 4 extremities. No evidence of extinction is noted.  Coordination: Cerebellar testing reveals good finger-nose-finger and heel-to-shin bilaterally.  Gait and station: Gait is normal.   DIAGNOSTIC DATA (LABS, IMAGING, TESTING) - I reviewed patient records, labs, notes, testing and imaging myself where available.  Lab Results  Component Value Date   WBC 4.2 02/09/2023   HGB 13.7 02/09/2023   HCT 41.2 02/09/2023   MCV 92.2 02/09/2023   PLT 286.0 02/09/2023      Component Value Date/Time   NA 135 02/09/2023 0852   K 3.9 02/09/2023 0852   CL 101 02/09/2023 0852   CO2 27 02/09/2023 0852   GLUCOSE 85 02/09/2023 0852   BUN 12 02/09/2023 0852   CREATININE 0.78 02/09/2023 0852   CALCIUM 9.6 02/09/2023 0852   PROT 7.3 02/09/2023 0852   ALBUMIN 4.4 02/09/2023 0852   AST 20 02/09/2023 0852   ALT 16 02/09/2023 0852   ALKPHOS 70 02/09/2023 0852   BILITOT 0.7 02/09/2023 0852   GFRNONAA >60 02/17/2010 0938   GFRAA  02/17/2010 0938    >60        The eGFR has been calculated using the MDRD equation. This calculation has not been validated in all clinical situations. eGFR's persistently <60 mL/min signify possible Chronic Kidney Disease.   Lab Results  Component Value Date   CHOL 160 03/17/2022   HDL 52.80 03/17/2022   LDLCALC 84  03/17/2022   TRIG 116.0 03/17/2022   CHOLHDL 3 03/17/2022   No results found for: "HGBA1C" No results found for: "VITAMINB12" Lab Results  Component Value Date   TSH 1.050 03/20/2023   Margie Ege, AGNP-C, DNP 09/02/2023, 2:51 PM [facility] [address] [place], [place] [address] [phone]

## 2023-09-02 ENCOUNTER — Other Ambulatory Visit (HOSPITAL_COMMUNITY): Payer: Self-pay

## 2023-09-02 ENCOUNTER — Ambulatory Visit: Payer: 59 | Admitting: Neurology

## 2023-09-02 ENCOUNTER — Encounter: Payer: Self-pay | Admitting: Neurology

## 2023-09-02 VITALS — BP 114/76 | HR 92 | Ht 64.0 in | Wt 173.4 lb

## 2023-09-02 DIAGNOSIS — G43109 Migraine with aura, not intractable, without status migrainosus: Secondary | ICD-10-CM

## 2023-09-02 MED ORDER — QULIPTA 60 MG PO TABS: 1.0000 | ORAL_TABLET | Freq: Every day | ORAL | 3 refills | Status: DC

## 2023-09-02 MED ORDER — SUMATRIPTAN SUCCINATE 100 MG PO TABS: ORAL_TABLET | ORAL | 11 refills | Status: AC

## 2023-09-02 NOTE — Patient Instructions (Signed)
 Great to see you today.  Will continue Qulipta for migraine prevention.  Continue Imitrex as needed.  Follow-up in 1 year.  Thanks!!

## 2023-09-03 ENCOUNTER — Other Ambulatory Visit (HOSPITAL_COMMUNITY): Payer: Self-pay

## 2023-09-03 ENCOUNTER — Telehealth: Payer: Self-pay | Admitting: Pharmacy Technician

## 2023-09-03 NOTE — Telephone Encounter (Signed)
 Pharmacy Patient Advocate Encounter   Received notification from CoverMyMeds that prior authorization for Qulipta 60MG  tablets is required/requested.   Insurance verification completed.   The patient is insured through [facility] .   Per test claim: Refill too soon. PA is not needed at this time. Medication was filled 08/04/2023. Next eligible fill date is 10/18/2023.

## 2023-09-07 ENCOUNTER — Other Ambulatory Visit (HOSPITAL_COMMUNITY): Payer: Self-pay

## 2023-09-25 ENCOUNTER — Other Ambulatory Visit (HOSPITAL_COMMUNITY): Payer: Self-pay

## 2023-09-25 ENCOUNTER — Telehealth: Payer: Self-pay

## 2023-09-25 NOTE — Telephone Encounter (Signed)
 Pharmacy Patient Advocate Encounter   Received notification from Fax that prior authorization for Qulipta 60MG  tablets is required/requested.   Insurance verification completed.   The patient is insured through [facility] .   Per test claim: PA required; PA submitted to above mentioned insurance via CoverMyMeds Key/confirmation #/EOC [id number] Status is pending    Pharmacy Patient Advocate Encounter  Received notification from [facility] that Prior Authorization for Qulipta 60MG  tablets has been APPROVED from 09/25/2023 to 09/24/2024. Ran test claim, Copay is $0. This test claim was processed through [facility]- copay amounts may vary at other pharmacies due to pharmacy/plan contracts, or as the patient moves through the different stages of their insurance plan.   PA #/Case ID/Reference #: PA Case ID #: [id number]

## 2023-10-25 ENCOUNTER — Other Ambulatory Visit: Payer: Self-pay | Admitting: Family Medicine

## 2023-11-19 ENCOUNTER — Inpatient Hospital Stay: Admission: RE | Admit: 2023-11-19 | Payer: 59 | Source: Ambulatory Visit

## 2023-12-28 ENCOUNTER — Other Ambulatory Visit (HOSPITAL_COMMUNITY): Payer: Self-pay | Admitting: Obstetrics and Gynecology

## 2023-12-28 DIAGNOSIS — N63 Unspecified lump in unspecified breast: Secondary | ICD-10-CM

## 2023-12-29 ENCOUNTER — Ambulatory Visit (HOSPITAL_COMMUNITY)
Admission: RE | Admit: 2023-12-29 | Discharge: 2023-12-29 | Disposition: A | Discharge status: Home / Self Care | Source: Ambulatory Visit | Attending: Obstetrics and Gynecology | Admitting: Obstetrics and Gynecology

## 2023-12-29 ENCOUNTER — Ambulatory Visit (HOSPITAL_COMMUNITY)
Admission: RE | Admit: 2023-12-29 | Discharge: 2023-12-29 | Discharge status: Home / Self Care | Source: Ambulatory Visit | Attending: Obstetrics and Gynecology | Admitting: Obstetrics and Gynecology

## 2023-12-29 DIAGNOSIS — N63 Unspecified lump in unspecified breast: Secondary | ICD-10-CM | POA: Insufficient documentation

## 2024-01-04 ENCOUNTER — Encounter

## 2024-01-04 ENCOUNTER — Other Ambulatory Visit

## 2024-04-01 ENCOUNTER — Encounter: Payer: Self-pay | Admitting: Family Medicine

## 2024-04-01 ENCOUNTER — Ambulatory Visit: Admitting: Family Medicine

## 2024-04-01 VITALS — BP 108/68 | HR 93 | Temp 98.3°F | Resp 17 | Ht 64.0 in | Wt 174.8 lb

## 2024-04-01 DIAGNOSIS — Z0000 Encounter for general adult medical examination without abnormal findings: Secondary | ICD-10-CM

## 2024-04-01 DIAGNOSIS — E559 Vitamin D deficiency, unspecified: Secondary | ICD-10-CM | POA: Diagnosis not present

## 2024-04-01 DIAGNOSIS — E669 Obesity, unspecified: Secondary | ICD-10-CM

## 2024-04-01 DIAGNOSIS — Z23 Encounter for immunization: Secondary | ICD-10-CM

## 2024-04-01 LAB — LIPID PANEL
Cholesterol: 172 mg/dL (ref 0–200)
HDL: 53.8 mg/dL (ref >39.00)
LDL Cholesterol: 91 mg/dL (ref 0–99)
NonHDL: 118.57
Total CHOL/HDL Ratio: 3
Triglycerides: 137 mg/dL (ref 0.0–149.0)
VLDL: 27.4 mg/dL (ref 0.0–40.0)

## 2024-04-01 LAB — TSH: TSH: 1.17 u[IU]/mL (ref 0.35–5.50)

## 2024-04-01 LAB — CBC WITH DIFFERENTIAL/PLATELET
Basophils Absolute: 0 K/uL (ref 0.0–0.1)
Basophils Relative: 0.7 % (ref 0.0–3.0)
Eosinophils Absolute: 0.2 K/uL (ref 0.0–0.7)
Eosinophils Relative: 3.7 % (ref 0.0–5.0)
HCT: 40.3 % (ref 36.0–46.0)
Hemoglobin: 13.6 g/dL (ref 12.0–15.0)
Lymphocytes Relative: 35.6 % (ref 12.0–46.0)
Lymphs Abs: 1.9 K/uL (ref 0.7–4.0)
MCHC: 33.8 g/dL (ref 30.0–36.0)
MCV: 91.2 fl (ref 78.0–100.0)
Monocytes Absolute: 0.5 K/uL (ref 0.1–1.0)
Monocytes Relative: 9.2 % (ref 3.0–12.0)
Neutro Abs: 2.7 K/uL (ref 1.4–7.7)
Neutrophils Relative %: 50.8 % (ref 43.0–77.0)
Platelets: 287 K/uL (ref 150.0–400.0)
RBC: 4.42 Mil/uL (ref 3.87–5.11)
RDW: 11.7 % (ref 11.5–15.5)
WBC: 5.4 K/uL (ref 4.0–10.5)

## 2024-04-01 LAB — BASIC METABOLIC PANEL WITH GFR
BUN: 11 mg/dL (ref 6–23)
CO2: 28 meq/L (ref 19–32)
Calcium: 9.4 mg/dL (ref 8.4–10.5)
Chloride: 102 meq/L (ref 96–112)
Creatinine, Ser: 0.74 mg/dL (ref 0.40–1.20)
GFR: 96.82 mL/min (ref >60.00)
Glucose, Bld: 100 mg/dL — ABNORMAL HIGH (ref 70–99)
Potassium: 4.2 meq/L (ref 3.5–5.1)
Sodium: 137 meq/L (ref 135–145)

## 2024-04-01 LAB — HEPATIC FUNCTION PANEL
ALT: 19 U/L (ref 0–35)
AST: 18 U/L (ref 0–37)
Albumin: 4.5 g/dL (ref 3.5–5.2)
Alkaline Phosphatase: 71 U/L (ref 39–117)
Bilirubin, Direct: 0.1 mg/dL (ref 0.0–0.3)
Total Bilirubin: 0.3 mg/dL (ref 0.2–1.2)
Total Protein: 7.5 g/dL (ref 6.0–8.3)

## 2024-04-01 LAB — VITAMIN D 25 HYDROXY (VIT D DEFICIENCY, FRACTURES): VITD: 20.71 ng/mL — ABNORMAL LOW (ref 30.00–100.00)

## 2024-04-01 NOTE — Progress Notes (Signed)
   Subjective:    Patient ID: Beverly Gray, female    DOB: [birth year], 48 y.o.   MRN: [id number]  HPI CPE- UTD on pap, mammo, colonoscopy, Tdap.  Due for flu  Patient Care Team    Relationship Specialty Notifications Start End  Mahlon Comer BRAVO, MD PCP - General Family Medicine  12/11/21   Sarrah Rosaline, MD Consulting Physician Obstetrics and Gynecology  11/09/15   Aneita Gwendlyn DASEN, MD (Inactive) Consulting Physician Gastroenterology  11/09/15      Health Maintenance  Topic Date Due   Hepatitis B Vaccines 19-59 Average Risk (1 of 3 - 19+ 3-dose series) Never done   Cervical Cancer Screening (HPV/Pap Cotest)  06/20/2023   Influenza Vaccine  01/29/2024   COVID-19 Vaccine (5 - 2025-26 season) 02/29/2024   Colonoscopy  10/27/2024   Mammogram  12/28/2024   DTaP/Tdap/Td (4 - Td or Tdap) 08/23/2027   Hepatitis C Screening  Completed   HIV Screening  Completed   Pneumococcal Vaccine  Aged Out   HPV VACCINES  Aged Out   Meningococcal B Vaccine  Aged Out      Review of Systems Patient reports no vision/ hearing changes, adenopathy,fever, weight change,  persistant/recurrent hoarseness , swallowing issues, chest pain, palpitations, edema, persistant/recurrent cough, hemoptysis, dyspnea (rest/exertional/paroxysmal nocturnal), gastrointestinal bleeding (melena, rectal bleeding), abdominal pain, significant heartburn, bowel changes, GU symptoms (dysuria, hematuria, incontinence), Gyn symptoms (abnormal  bleeding, pain),  syncope, focal weakness, memory loss, numbness & tingling, skin/hair/nail changes, abnormal bruising or bleeding, anxiety, or depression.     Objective:   Physical Exam General Appearance:    Alert, cooperative, no distress, appears stated age  Head:    Normocephalic, without obvious abnormality, atraumatic  Eyes:    PERRL, conjunctiva/corneas clear, EOM's intact both eyes  Ears:    Normal TM's and external ear canals, both ears  Nose:   Nares normal, septum  midline, mucosa normal, no drainage    or sinus tenderness  Throat:   Lips, mucosa, and tongue normal; teeth and gums normal  Neck:   Supple, symmetrical, trachea midline, no adenopathy;    Thyroid : no enlargement/tenderness/nodules  Back:     Symmetric, no curvature, ROM normal, no CVA tenderness  Lungs:     Clear to auscultation bilaterally, respirations unlabored  Chest Wall:    No tenderness or deformity   Heart:    Regular rate and rhythm, S1 and S2 normal, no murmur, rub   or gallop  Breast Exam:    Deferred to GYN  Abdomen:     Soft, non-tender, bowel sounds active all four quadrants,    no masses, no organomegaly  Genitalia:    Deferred to GYN  Rectal:    Extremities:   Extremities normal, atraumatic, no cyanosis or edema  Pulses:   2+ and symmetric all extremities  Skin:   Skin color, texture, turgor normal, no rashes or lesions  Lymph nodes:   Cervical, supraclavicular, and axillary nodes normal  Neurologic:   CNII-XII intact, normal strength, sensation and reflexes    throughout          Assessment & Plan:

## 2024-04-01 NOTE — Patient Instructions (Signed)
Follow up in 1 year or as needed We'll notify you of your lab results and make any changes if needed Continue to work on healthy diet and regular exercise- you can do it! Call with any questions or concerns Stay Safe!  Stay Healthy! Happy Fall!! 

## 2024-04-01 NOTE — Assessment & Plan Note (Signed)
Pt's PE WNL w/ exception of BMI.  UTD on pap, mammo, colonoscopy, Tdap.  Flu shot given.  Check labs.  Anticipatory guidance provided.

## 2024-04-04 ENCOUNTER — Ambulatory Visit: Payer: Self-pay | Admitting: Family Medicine

## 2024-04-04 ENCOUNTER — Other Ambulatory Visit: Payer: Self-pay

## 2024-04-04 MED ORDER — VITAMIN D (ERGOCALCIFEROL) 1.25 MG (50000 UNIT) PO CAPS: 50000.0000 [IU] | ORAL_CAPSULE | ORAL | 0 refills | Status: AC

## 2024-04-04 NOTE — Progress Notes (Signed)
 Pt has reviewed labs via MyChart Vitamin D  has been sent in

## 2024-04-05 ENCOUNTER — Ambulatory Visit: Payer: 59 | Admitting: Allergy

## 2024-04-12 ENCOUNTER — Other Ambulatory Visit: Payer: Self-pay | Admitting: Obstetrics and Gynecology

## 2024-04-12 DIAGNOSIS — Z803 Family history of malignant neoplasm of breast: Secondary | ICD-10-CM

## 2024-04-20 ENCOUNTER — Other Ambulatory Visit: Payer: Self-pay | Admitting: Family Medicine

## 2024-07-06 ENCOUNTER — Ambulatory Visit
Admission: RE | Admit: 2024-07-06 | Discharge: 2024-07-06 | Disposition: A | Discharge status: Home / Self Care | Source: Ambulatory Visit | Attending: Obstetrics and Gynecology | Admitting: Obstetrics and Gynecology

## 2024-07-06 DIAGNOSIS — Z803 Family history of malignant neoplasm of breast: Secondary | ICD-10-CM

## 2024-07-06 MED ORDER — GADOPICLENOL 0.5 MMOL/ML IV SOLN
8.0000 mL | Freq: Once | INTRAVENOUS | Status: AC | PRN
  Administered 2024-07-06: 8 mL via INTRAVENOUS

## 2024-07-10 ENCOUNTER — Other Ambulatory Visit: Payer: Self-pay | Admitting: Neurology

## 2024-07-11 ENCOUNTER — Telehealth: Payer: Self-pay

## 2024-07-11 ENCOUNTER — Other Ambulatory Visit (HOSPITAL_COMMUNITY): Payer: Self-pay

## 2024-07-11 ENCOUNTER — Telehealth: Payer: Self-pay | Admitting: *Deleted

## 2024-07-11 NOTE — Telephone Encounter (Signed)
 Pharmacy Patient Advocate Encounter   Received notification from Physician's Office that prior authorization for Qulipta  is required/requested.   Insurance verification completed.   The patient is insured through [employer].   Per test claim: Refill too soon. PA is not needed at this time. Medication was filled 06/06/2024. Next eligible fill date is 08/20/2024.

## 2024-07-11 NOTE — Telephone Encounter (Signed)
 Message sent to PA team.

## 2024-07-11 NOTE — Telephone Encounter (Signed)
 SABRA

## 2024-07-15 NOTE — Telephone Encounter (Signed)
 Please see encounter from 07-11-2024. PA is not required, medication is too soon to fill.

## 2024-07-19 ENCOUNTER — Encounter: Payer: Self-pay | Admitting: Neurology

## 2024-07-21 ENCOUNTER — Other Ambulatory Visit (HOSPITAL_COMMUNITY): Payer: Self-pay

## 2024-07-21 NOTE — Telephone Encounter (Signed)
 Hi there. Issue is not that a prior shara is require. Refills are not covered through retail, and patient must fill 90-day supply through mail order. Patient to set up mail order.

## 2024-07-27 NOTE — Telephone Encounter (Signed)
 PA IS NOT NEEDED......SABRAPlease read the previous encounter.

## 2024-07-28 NOTE — Telephone Encounter (Signed)
 SABRA

## 2024-08-01 MED ORDER — QULIPTA 60 MG PO TABS: 1.0000 | ORAL_TABLET | Freq: Every day | ORAL | 3 refills | Status: AC

## 2024-08-01 NOTE — Telephone Encounter (Signed)
 Refilled for 90 day at [facility] mail order

## 2024-09-07 ENCOUNTER — Telehealth: Admitting: Neurology

## 2025-04-03 ENCOUNTER — Encounter: Admitting: Family Medicine
